# Patient Record
Sex: Female | Born: 1965 | Race: White | Hispanic: No | Marital: Married | Smoking: Current every day smoker
Health system: Southern US, Community
[De-identification: ages and names within clinical notes are randomized; demographics above are authoritative.]

## PROBLEM LIST (undated history)

## (undated) DIAGNOSIS — F329 Major depressive disorder, single episode, unspecified: Secondary | ICD-10-CM

## (undated) DIAGNOSIS — F429 Obsessive-compulsive disorder, unspecified: Secondary | ICD-10-CM

## (undated) DIAGNOSIS — F419 Anxiety disorder, unspecified: Secondary | ICD-10-CM

## (undated) DIAGNOSIS — Z720 Tobacco use: Secondary | ICD-10-CM

## (undated) DIAGNOSIS — G43909 Migraine, unspecified, not intractable, without status migrainosus: Secondary | ICD-10-CM

## (undated) DIAGNOSIS — F32A Depression, unspecified: Secondary | ICD-10-CM

## (undated) HISTORY — DX: Obsessive-compulsive disorder, unspecified: F42.9

## (undated) HISTORY — DX: Tobacco use: Z72.0

## (undated) HISTORY — PX: WISDOM TOOTH EXTRACTION: SHX21

## (undated) HISTORY — DX: Depression, unspecified: F32.A

## (undated) HISTORY — PX: BUNIONECTOMY: SHX129

## (undated) HISTORY — DX: Migraine, unspecified, not intractable, without status migrainosus: G43.909

## (undated) HISTORY — DX: Anxiety disorder, unspecified: F41.9

## (undated) HISTORY — DX: Major depressive disorder, single episode, unspecified: F32.9

---

## 2004-07-26 ENCOUNTER — Emergency Department: Payer: Self-pay | Admitting: Emergency Medicine

## 2004-07-27 ENCOUNTER — Ambulatory Visit: Payer: Self-pay | Admitting: Emergency Medicine

## 2005-07-27 ENCOUNTER — Ambulatory Visit: Payer: Self-pay | Admitting: Unknown Physician Specialty

## 2005-07-27 LAB — HM COLONOSCOPY

## 2005-11-15 ENCOUNTER — Ambulatory Visit: Payer: Self-pay | Admitting: Urology

## 2006-09-22 ENCOUNTER — Emergency Department: Payer: Self-pay

## 2009-01-07 HISTORY — PX: BREAST BIOPSY: SHX20

## 2009-05-07 ENCOUNTER — Ambulatory Visit: Payer: Self-pay | Admitting: Internal Medicine

## 2009-05-31 ENCOUNTER — Ambulatory Visit: Payer: Self-pay | Admitting: Internal Medicine

## 2009-06-07 ENCOUNTER — Ambulatory Visit: Payer: Self-pay | Admitting: Internal Medicine

## 2009-06-09 ENCOUNTER — Ambulatory Visit: Payer: Self-pay | Admitting: Surgery

## 2009-06-15 ENCOUNTER — Ambulatory Visit: Payer: Self-pay | Admitting: Surgery

## 2010-05-14 ENCOUNTER — Ambulatory Visit: Payer: Self-pay

## 2010-08-07 ENCOUNTER — Ambulatory Visit: Payer: Self-pay

## 2011-08-10 ENCOUNTER — Emergency Department: Payer: Self-pay | Admitting: Emergency Medicine

## 2011-11-08 ENCOUNTER — Ambulatory Visit: Payer: Self-pay

## 2012-11-12 ENCOUNTER — Ambulatory Visit: Payer: Self-pay | Admitting: Neurology

## 2013-03-03 ENCOUNTER — Ambulatory Visit: Payer: Self-pay | Admitting: Orthopedic Surgery

## 2014-01-17 ENCOUNTER — Ambulatory Visit: Payer: Self-pay | Admitting: Anesthesiology

## 2014-02-15 ENCOUNTER — Emergency Department: Payer: Self-pay | Admitting: Internal Medicine

## 2014-02-23 LAB — HM PAP SMEAR: HM PAP: NEGATIVE

## 2014-03-15 ENCOUNTER — Ambulatory Visit: Payer: Self-pay

## 2014-03-15 LAB — HM MAMMOGRAPHY

## 2014-08-14 ENCOUNTER — Emergency Department
Admission: EM | Admit: 2014-08-14 | Discharge: 2014-08-14 | Disposition: A | Discharge status: Home / Self Care | Payer: BLUE CROSS/BLUE SHIELD | Attending: Emergency Medicine | Admitting: Emergency Medicine

## 2014-08-14 ENCOUNTER — Other Ambulatory Visit: Payer: Self-pay

## 2014-08-14 ENCOUNTER — Encounter: Payer: Self-pay | Admitting: Emergency Medicine

## 2014-08-14 ENCOUNTER — Emergency Department: Payer: BLUE CROSS/BLUE SHIELD

## 2014-08-14 DIAGNOSIS — S2232XA Fracture of one rib, left side, initial encounter for closed fracture: Secondary | ICD-10-CM | POA: Insufficient documentation

## 2014-08-14 DIAGNOSIS — Y998 Other external cause status: Secondary | ICD-10-CM | POA: Insufficient documentation

## 2014-08-14 DIAGNOSIS — X58XXXA Exposure to other specified factors, initial encounter: Secondary | ICD-10-CM | POA: Diagnosis not present

## 2014-08-14 DIAGNOSIS — Y9289 Other specified places as the place of occurrence of the external cause: Secondary | ICD-10-CM | POA: Insufficient documentation

## 2014-08-14 DIAGNOSIS — Z720 Tobacco use: Secondary | ICD-10-CM | POA: Insufficient documentation

## 2014-08-14 DIAGNOSIS — Y9341 Activity, dancing: Secondary | ICD-10-CM | POA: Diagnosis not present

## 2014-08-14 DIAGNOSIS — S299XXA Unspecified injury of thorax, initial encounter: Secondary | ICD-10-CM | POA: Diagnosis present

## 2014-08-14 LAB — BASIC METABOLIC PANEL
Anion gap: 8 (ref 5–15)
BUN: 15 mg/dL (ref 6–20)
CO2: 27 mmol/L (ref 22–32)
CREATININE: 0.62 mg/dL (ref 0.44–1.00)
Calcium: 10.2 mg/dL (ref 8.9–10.3)
Chloride: 104 mmol/L (ref 101–111)
Glucose, Bld: 94 mg/dL (ref 65–99)
POTASSIUM: 4 mmol/L (ref 3.5–5.1)
Sodium: 139 mmol/L (ref 135–145)

## 2014-08-14 LAB — CBC
HCT: 37.8 % (ref 35.0–47.0)
Hemoglobin: 13.1 g/dL (ref 12.0–16.0)
MCH: 33.5 pg (ref 26.0–34.0)
MCHC: 34.8 g/dL (ref 32.0–36.0)
MCV: 96.3 fL (ref 80.0–100.0)
Platelets: 383 10*3/uL (ref 150–440)
RBC: 3.92 MIL/uL (ref 3.80–5.20)
RDW: 12.7 % (ref 11.5–14.5)
WBC: 10 10*3/uL (ref 3.6–11.0)

## 2014-08-14 LAB — TROPONIN I: Troponin I: <0.03 ng/mL (ref <0.031)

## 2014-08-14 MED ORDER — NAPROXEN 250 MG PO TABS: 250.0000 mg | ORAL_TABLET | Freq: Two times a day (BID) | ORAL | Status: DC

## 2014-08-14 MED ORDER — OXYCODONE HCL 5 MG PO TABS: 5.0000 mg | ORAL_TABLET | Freq: Four times a day (QID) | ORAL | Status: DC | PRN

## 2014-08-14 NOTE — ED Provider Notes (Signed)
[facility] Emergency Department Provider Note  ____________________________________________  Time seen: 4:00 PM  I have reviewed the triage vital signs and the nursing notes.   HISTORY  Chief Complaint Chest Pain    HPI Shelly Tyler is a 49 y.o. female who complains of left chest pain for the past 5 days. She did travel by airplane to [place] 1-2 weeks ago. She has no history of DVT or PE. Upon returning she was in her usual state of health. However, after going dancing late into the night on Monday, she woke up 5 days ago on Tuesday with sharp left chest pain. Hurts to take a deep breath. It's nonradiating. No exertional pain fever chills cough hemoptysis abdominal pain nausea vomiting or diarrhea. No history of trauma.Worse with movement and lying on her left side, no alleviating factors.     History reviewed. No pertinent past medical history.  There are no active problems to display for this patient.   Past Surgical History  Procedure Laterality Date  . Cesarean section    . Bunionectomy    . Breast biopsy      Current Outpatient Rx  Name  Route  Sig  Dispense  Refill  . naproxen (NAPROSYN) 250 MG tablet   Oral   Take 1 tablet (250 mg total) by mouth 2 (two) times daily with a meal.   40 tablet   0   . oxyCODONE (ROXICODONE) 5 MG immediate release tablet   Oral   Take 1 tablet (5 mg total) by mouth every 6 (six) hours as needed for breakthrough pain.   20 tablet   0     Allergies Review of patient's allergies indicates no known allergies.  No family history on file.  Social History History  Substance Use Topics  . Smoking status: Current Every Day Smoker    Types: Cigarettes  . Smokeless tobacco: Not on file  . Alcohol Use: No    Review of Systems  Constitutional: No fever or chills. No weight changes Eyes:No blurry vision or double vision.  ENT: No sore throat. Cardiovascular: Chest pain as above Respiratory: No  dyspnea or cough. Gastrointestinal: Negative for abdominal pain, vomiting and diarrhea.  No BRBPR or melena. Genitourinary: Negative for dysuria, urinary retention, bloody urine, or difficulty urinating. Musculoskeletal: Negative for back pain. No joint swelling or pain. Skin: Negative for rash. Neurological: Negative for headaches, focal weakness or numbness. Psychiatric:No anxiety or depression.   Endocrine:No hot/cold intolerance, changes in energy, or sleep difficulty.  10-point ROS otherwise negative.  ____________________________________________   PHYSICAL EXAM:  VITAL SIGNS: ED Triage Vitals  Enc Vitals Group     BP 08/14/14 1513 134/82 mmHg     Pulse Rate 08/14/14 1513 82     Resp 08/14/14 1606 18     Temp 08/14/14 1513 98.4 F (36.9 C)     Temp Source 08/14/14 1513 Oral     SpO2 08/14/14 1513 98 %     Weight 08/14/14 1513 128 lb (58.06 kg)     Height 08/14/14 1513  (1.676 m)     Head Cir --      Peak Flow --      Pain Score 08/14/14 1516 7     Pain Loc --      Pain Edu? --      Excl. in GC? --      Constitutional: Alert and oriented. Well appearing and in no distress. Eyes: No scleral icterus. No conjunctival  pallor. PERRL. EOMI ENT   Head: Normocephalic and atraumatic.   Nose: No congestion/rhinnorhea. No septal hematoma   Mouth/Throat: MMM, no pharyngeal erythema. No peritonsillar mass. No uvula shift.   Neck: No stridor. No SubQ emphysema. No meningismus. Hematological/Lymphatic/Immunilogical: No cervical lymphadenopathy. Cardiovascular: RRR. Normal and symmetric distal pulses are present in all extremities. No murmurs, rubs, or gallops. Respiratory: Normal respiratory effort without tachypnea nor retractions. Breath sounds are clear and equal bilaterally. No wheezes/rales/rhonchi. Exquisite tenderness to palpation on the left inferior chest wall in the anterolateral inferior ribs. Gastrointestinal: Soft and nontender. No distention. There  is no CVA tenderness.  No rebound, rigidity, or guarding. Genitourinary: deferred Musculoskeletal: Nontender with normal range of motion in all extremities. No joint effusions.  No lower extremity tenderness.  No edema. Neurologic:   Normal speech and language.  CN 2-10 normal. Motor grossly intact. No pronator drift.  Normal gait. No gross focal neurologic deficits are appreciated.  Skin:  Skin is warm, dry and intact. No rash noted.  No petechiae, purpura, or bullae. Psychiatric: Mood and affect are normal. Speech and behavior are normal. Patient exhibits appropriate insight and judgment.  ____________________________________________    LABS (pertinent positives/negatives) (all labs ordered are listed, but only abnormal results are displayed) Labs Reviewed  BASIC METABOLIC PANEL  CBC  TROPONIN I   ____________________________________________   EKG  Interpreted by me Normal sinus rhythm rate of 71, normal axis and intervals, poor R-wave progression in anterior precordial leads, normal ST segments and T waves.  ____________________________________________    RADIOLOGY  Chest x-ray reveals a nondisplaced fracture of the left lateral 10th rib in the area of tenderness.  ____________________________________________   PROCEDURES  ____________________________________________   INITIAL IMPRESSION / ASSESSMENT AND PLAN / ED COURSE  Pertinent labs & imaging results that were available during my care of the patient were reviewed by me and considered in my medical decision making (see chart for details).  Patient presents with left chest pain that is found to be due to a rib fracture. Exam and chest x-ray are entirely consistent with this and corroborate each other. There is no history of trauma, although the patient's dancing may have led to a nontraumatic minor injury. No suspicions for cancer on the x-ray. We'll have the patient follow up with primary care while giving her  an incentive spirometer and having her take NSAIDs and oxycodone as needed.  ____________________________________________   FINAL CLINICAL IMPRESSION(S) / ED DIAGNOSES  Final diagnoses:  Left rib fracture, closed, initial encounter      Sharman Cheek, MD 08/14/14 1615

## 2014-08-14 NOTE — ED Notes (Signed)
Pt informed to return if any life threatening symptoms occur.  

## 2014-08-14 NOTE — Discharge Instructions (Signed)

## 2014-08-14 NOTE — ED Notes (Addendum)
Pt reports flying to [place] last week, reports she went dancing Monday night and woke up Tuesday with left sided chest pain. Pt reports pain just has gotten increasingly worse. Pt denies any shortness of breath. Pt does smoke cigarettes. Pt reports pain with movement and pain with coughing, laughing.

## 2015-02-19 ENCOUNTER — Emergency Department: Payer: BLUE CROSS/BLUE SHIELD

## 2015-02-19 ENCOUNTER — Emergency Department
Admission: EM | Admit: 2015-02-19 | Discharge: 2015-02-19 | Disposition: A | Discharge status: Home / Self Care | Payer: BLUE CROSS/BLUE SHIELD | Attending: Emergency Medicine | Admitting: Emergency Medicine

## 2015-02-19 DIAGNOSIS — Z791 Long term (current) use of non-steroidal anti-inflammatories (NSAID): Secondary | ICD-10-CM | POA: Insufficient documentation

## 2015-02-19 DIAGNOSIS — M654 Radial styloid tenosynovitis [de Quervain]: Secondary | ICD-10-CM

## 2015-02-19 DIAGNOSIS — F17210 Nicotine dependence, cigarettes, uncomplicated: Secondary | ICD-10-CM | POA: Insufficient documentation

## 2015-02-19 MED ORDER — MELOXICAM 15 MG PO TABS: 15.0000 mg | ORAL_TABLET | Freq: Every day | ORAL | Status: DC

## 2015-02-19 NOTE — ED Notes (Signed)
Pt reports falling Sunday in the Shower. Pt states she caught herself Pain in left wrist.

## 2015-02-19 NOTE — ED Provider Notes (Signed)
[facility] Emergency Department Provider Note  ____________________________________________  Time seen: Approximately 3:23 PM  I have reviewed the triage vital signs and the nursing notes.   HISTORY  Chief Complaint Wrist Pain    HPI Shelly Tyler is a 50 y.o. female presents emergency department complaining of left wrist pain.Patient states that symptoms initially began 3 weeks ago. No history of injury at that time. Patient states that the pain is on the dorsal aspect of the hand centering about the thumb. Patient states that 2-3 days ago she suffered a fall and caught herself with her left hand. This is increased pain in her left hand/wrist. Patient denies any numbness or tingling, decreased grip strength, pain in the elbow. Patient has not taken any medication prior to arrival.   No past medical history on file.  There are no active problems to display for this patient.   Past Surgical History  Procedure Laterality Date  . Cesarean section    . Bunionectomy    . Breast biopsy      Current Outpatient Rx  Name  Route  Sig  Dispense  Refill  . meloxicam (MOBIC) 15 MG tablet   Oral   Take 1 tablet (15 mg total) by mouth daily.   30 tablet   0   . naproxen (NAPROSYN) 250 MG tablet   Oral   Take 1 tablet (250 mg total) by mouth 2 (two) times daily with a meal.   40 tablet   0   . oxyCODONE (ROXICODONE) 5 MG immediate release tablet   Oral   Take 1 tablet (5 mg total) by mouth every 6 (six) hours as needed for breakthrough pain.   20 tablet   0     Allergies Review of patient's allergies indicates no known allergies.  No family history on file.  Social History Social History  Substance Use Topics  . Smoking status: Current Every Day Smoker    Types: Cigarettes  . Smokeless tobacco: Not on file  . Alcohol Use: No     Review of Systems  Constitutional: No fever/chills Musculoskeletal: Negative for back pain. Positive for left  wrist pain. Skin: Negative for rash. Neurological: Negative for headaches, focal weakness or numbness. 10-point ROS otherwise negative.  ____________________________________________   PHYSICAL EXAM:  VITAL SIGNS: ED Triage Vitals  Enc Vitals Group     BP 02/19/15 1309 119/78 mmHg     Pulse Rate 02/19/15 1309 93     Resp 02/19/15 1309 18     Temp 02/19/15 1309 98.6 F (37 C)     Temp Source 02/19/15 1309 Oral     SpO2 02/19/15 1309 98 %     Weight 02/19/15 1309 130 lb (58.968 kg)     Height 02/19/15 1309  (1.676 m)     Head Cir --      Peak Flow --      Pain Score 02/19/15 1311 5     Pain Loc --      Pain Edu? --      Excl. in GC? --      Constitutional: Alert and oriented. Well appearing and in no acute distress. Cardiovascular: Normal rate, regular rhythm. Normal S1 and S2.  Good peripheral circulation. Respiratory: Normal respiratory effort without tachypnea or retractions. Lungs CTAB. Musculoskeletal: No lower extremity tenderness nor edema.  No joint effusions. No visible deformity to left wrist when comparing the right. Full range of motion of wrist. No palpable abnormality.  Patient is mild tenderness to palpation over the MCP joint of the thumb. Negative Tinel's and Phalen's. Positive Finkelstein's. Radial pulse intact. Brisk capillary refill 5 digits. Sensation intact all 5 digits. Neurologic:  Normal speech and language. No gross focal neurologic deficits are appreciated.  Skin:  Skin is warm, dry and intact. No rash noted. Psychiatric: Mood and affect are normal. Speech and behavior are normal. Patient exhibits appropriate insight and judgement.   ____________________________________________   LABS (all labs ordered are listed, but only abnormal results are displayed)  Labs Reviewed - No data to display ____________________________________________  EKG   ____________________________________________  RADIOLOGY Festus Barren Cuthriell, personally  viewed and evaluated these images (plain radiographs) as part of my medical decision making, as well as reviewing the written report by the radiologist.  Dg Wrist Complete Left  02/19/2015  CLINICAL DATA:  Pain following fall 1 week prior EXAM: LEFT WRIST - COMPLETE 3+ VIEW COMPARISON:  None. FINDINGS: Frontal, oblique, lateral, and ulnar deviation scaphoid images were obtained. There is no demonstrable fracture or dislocation. Joint spaces appear normal. No erosive change. IMPRESSION: No demonstrable fracture or dislocation. No appreciable arthropathy. Electronically Signed   By: Bretta Bang III M.D.   On: 02/19/2015 13:59    ____________________________________________    PROCEDURES  Procedure(s) performed:       Medications - No data to display   ____________________________________________   INITIAL IMPRESSION / ASSESSMENT AND PLAN / ED COURSE  Pertinent labs & imaging results that were available during my care of the patient were reviewed by me and considered in my medical decision making (see chart for details).  Patient's diagnosis is consistent with de Quervain's tenosynovitis. Patient will be discharged home with prescriptions for anti-inflammatories and instructions to use a thumb spica splint at home.. Patient is to follow up with orthopedics if symptoms persist past this treatment course. Patient is given ED precautions to return to the ED for any worsening or new symptoms.     ____________________________________________  FINAL CLINICAL IMPRESSION(S) / ED DIAGNOSES  Final diagnoses:  De Quervain's tenosynovitis, left      NEW MEDICATIONS STARTED DURING THIS VISIT:  New Prescriptions   MELOXICAM (MOBIC) 15 MG TABLET    Take 1 tablet (15 mg total) by mouth daily.        Delorise Royals Cuthriell, PA-C 02/19/15 1528  Myrna Blazer, MD 02/19/15 [phone]

## 2015-02-19 NOTE — ED Notes (Signed)
Patient states she started having wrist pain 3 weeks ago and fell on Thursday and tried to catch herself on the left hand.  Patient c/o pain when moving her wrist but is able to grab items and make a fist without difficulty.

## 2015-02-19 NOTE — Discharge Instructions (Signed)
De Quervain Tenosynovitis °Tendons attach muscles to bones. They also help with joint movements. When tendons become irritated or swollen, it is called tendinitis. °The extensor pollicis brevis (EPB) tendon connects the EPB muscle to a bone that is near the base of the thumb. The EPB muscle helps to straighten and extend the thumb. De Quervain tenosynovitis is a condition in which the EPB tendon lining (sheath) becomes irritated, thickened, and swollen. This condition is sometimes called stenosing tenosynovitis. This condition causes pain on the thumb side of the back of the wrist. °CAUSES °Causes of this condition include: °· Activities that repeatedly cause your thumb and wrist to extend. °· A sudden increase in activity or change in activity that affects your wrist. °RISK FACTORS: °This condition is more likely to develop in: °· Females. °· People who have diabetes. °· Women who have recently given birth. °· People who are over 40 years of age. °· People who do activities that involve repeated hand and wrist motions, such as tennis, racquetball, volleyball, gardening, and taking care of children. °· People who do heavy labor. °· People who have poor wrist strength and flexibility. °· People who do not warm up properly before activities. °SYMPTOMS °Symptoms of this condition include: °· Pain or tenderness over the thumb side of the back of the wrist when your thumb and wrist are not moving. °· Pain that gets worse when you straighten your thumb or extend your thumb or wrist. °· Pain when the injured area is touched. °· Locking or catching of the thumb joint while you bend and straighten your thumb. °· Decreased thumb motion due to pain. °· Swelling over the affected area. °DIAGNOSIS °This condition is diagnosed with a medical history and physical exam. Your health care provider will ask for details about your injury and ask about your symptoms. °TREATMENT °Treatment may include the use of icing and medicines to  reduce pain and swelling. You may also be advised to wear a splint or brace to limit your thumb and wrist motion. In less severe cases, treatment may also include working with a physical therapist to strengthen your wrist and calm the irritation around your EPB tendon sheath. In severe cases, surgery may be needed. °HOME CARE INSTRUCTIONS °If You Have a Splint or Brace: °· Wear it as told by your health care provider. Remove it only as told by your health care provider. °· Loosen the splint or brace if your fingers become numb and tingle, or if they turn cold and blue. °· Keep the splint or brace clean and dry. °Managing Pain, Stiffness, and Swelling  °· If directed, apply ice to the injured area. °¨ Put ice in a plastic bag. °¨ Place a towel between your skin and the bag. °¨ Leave the ice on for 20 minutes, 2-3 times per day. °· Move your fingers often to avoid stiffness and to lessen swelling. °· Raise (elevate) the injured area above the level of your heart while you are sitting or lying down. °General Instructions °· Return to your normal activities as told by your health care provider. Ask your health care provider what activities are safe for you. °· Take over-the-counter and prescription medicines only as told by your health care provider. °· Keep all follow-up visits as told by your health care provider. This is important. °· Do not drive or operate heavy machinery while taking prescription pain medicine. °SEEK MEDICAL CARE IF: °· Your pain, tenderness, or swelling gets worse, even if you have had   treatment.  You have numbness or tingling in your wrist, hand, or fingers on the injured side.   This information is not intended to replace advice given to you by your health care provider. Make sure you discuss any questions you have with your health care provider.   Document Released: 12/24/2004 Document Revised: 09/14/2014 Document Reviewed: 03/01/2014 Elsevier Interactive Patient Education 2016  [employer].    Obtain a bone graft thumb spica splint from pharmacy or [facility]. Uses much as possible during the day, but wear all night.

## 2015-02-23 ENCOUNTER — Emergency Department
Admission: EM | Admit: 2015-02-23 | Discharge: 2015-02-23 | Disposition: A | Discharge status: Home / Self Care | Payer: BLUE CROSS/BLUE SHIELD | Attending: Emergency Medicine | Admitting: Emergency Medicine

## 2015-02-23 DIAGNOSIS — W57XXXA Bitten or stung by nonvenomous insect and other nonvenomous arthropods, initial encounter: Secondary | ICD-10-CM | POA: Insufficient documentation

## 2015-02-23 DIAGNOSIS — Y9289 Other specified places as the place of occurrence of the external cause: Secondary | ICD-10-CM | POA: Diagnosis not present

## 2015-02-23 DIAGNOSIS — L089 Local infection of the skin and subcutaneous tissue, unspecified: Secondary | ICD-10-CM | POA: Diagnosis not present

## 2015-02-23 DIAGNOSIS — F17210 Nicotine dependence, cigarettes, uncomplicated: Secondary | ICD-10-CM | POA: Insufficient documentation

## 2015-02-23 DIAGNOSIS — Z791 Long term (current) use of non-steroidal anti-inflammatories (NSAID): Secondary | ICD-10-CM | POA: Insufficient documentation

## 2015-02-23 DIAGNOSIS — Y9389 Activity, other specified: Secondary | ICD-10-CM | POA: Diagnosis not present

## 2015-02-23 DIAGNOSIS — Y998 Other external cause status: Secondary | ICD-10-CM | POA: Diagnosis not present

## 2015-02-23 DIAGNOSIS — S0086XA Insect bite (nonvenomous) of other part of head, initial encounter: Secondary | ICD-10-CM | POA: Diagnosis not present

## 2015-02-23 MED ORDER — SULFAMETHOXAZOLE-TRIMETHOPRIM 800-160 MG PO TABS: 1.0000 | ORAL_TABLET | Freq: Two times a day (BID) | ORAL | Status: DC

## 2015-02-23 MED ORDER — OXYCODONE-ACETAMINOPHEN 5-325 MG PO TABS: 1.0000 | ORAL_TABLET | ORAL | Status: DC | PRN

## 2015-02-23 NOTE — ED Notes (Signed)
Pt with red infected area to chin, unsure of insect bite.

## 2015-02-23 NOTE — ED Notes (Signed)
Pt reports swelling/pain to chin x 2 days.  Pt states unsure of bug bite.  Pt reports numbness/tingling to area.  Pt reports using warm compress w/o relief.  Pt NAD at this time, respirations equal and unlabored, skin warm and dry.

## 2015-02-23 NOTE — ED Provider Notes (Signed)
[facility] Emergency Department Provider Note  ____________________________________________  Time seen: Approximately 8:53 PM  I have reviewed the triage vital signs and the nursing notes.   HISTORY  Chief Complaint Wound Infection    HPI Shelly Tyler is a 50 y.o. female she complaining of swollen pain to the anterior inferior chin onset was 2 days ago status post being bit by insect of unknown origin. Patient denies any fever with this complaint. Patient state mildly displaced warm compresses to the area. Patient has taken ibuprofen. No other palliative measures.She rates the pain as a 8/10. Patient describes the pain as "sharp".   No past medical history on file.  There are no active problems to display for this patient.   Past Surgical History  Procedure Laterality Date  . Cesarean section    . Bunionectomy    . Breast biopsy      Current Outpatient Rx  Name  Route  Sig  Dispense  Refill  . meloxicam (MOBIC) 15 MG tablet   Oral   Take 1 tablet (15 mg total) by mouth daily.   30 tablet   0   . naproxen (NAPROSYN) 250 MG tablet   Oral   Take 1 tablet (250 mg total) by mouth 2 (two) times daily with a meal.   40 tablet   0   . oxyCODONE (ROXICODONE) 5 MG immediate release tablet   Oral   Take 1 tablet (5 mg total) by mouth every 6 (six) hours as needed for breakthrough pain.   20 tablet   0   . oxyCODONE-acetaminophen (ROXICET) 5-325 MG tablet   Oral   Take 1 tablet by mouth every 4 (four) hours as needed for severe pain.   30 tablet   0   . sulfamethoxazole-trimethoprim (BACTRIM DS,SEPTRA DS) 800-160 MG tablet   Oral   Take 1 tablet by mouth 2 (two) times daily.   20 tablet   0     Allergies Review of patient's allergies indicates no known allergies.  No family history on file.  Social History Social History  Substance Use Topics  . Smoking status: Current Every Day Smoker    Types: Cigarettes  . Smokeless tobacco:  Not on file  . Alcohol Use: No    Review of Systems Constitutional: No fever/chills Eyes: No visual changes. ENT: No sore throat. Cardiovascular: Denies chest pain. Respiratory: Denies shortness of breath. Gastrointestinal: No abdominal pain.  No nausea, no vomiting.  No diarrhea.  No constipation. Genitourinary: Negative for dysuria. Musculoskeletal: Negative for back pain. Skin: Negative for rash. Redness and swelling to the chin.  Neurological: Negative for headaches, focal weakness or numbness. 10-point ROS otherwise negative.  ____________________________________________   PHYSICAL EXAM:  VITAL SIGNS: ED Triage Vitals  Enc Vitals Group     BP 02/23/15 2043 133/77 mmHg     Pulse Rate 02/23/15 2043 74     Resp 02/23/15 2043 18     Temp 02/23/15 2043 98.2 F (36.8 C)     Temp Source 02/23/15 2043 Oral     SpO2 02/23/15 2043 97 %     Weight --      Height --      Head Cir --      Peak Flow --      Pain Score 02/23/15 2036 8     Pain Loc --      Pain Edu? --      Excl. in GC? --  Constitutional: Alert and oriented. Well appearing and in no acute distress. Eyes: Conjunctivae are normal. PERRL. EOMI. Head: Atraumatic. Nose: No congestion/rhinnorhea. Mouth/Throat: Mucous membranes are moist.  Oropharynx non-erythematous. Neck: No stridor.  No cervical spine tenderness to palpation. Hematological/Lymphatic/Immunilogical: No cervical lymphadenopathy. Cardiovascular: Normal rate, regular rhythm. Grossly normal heart sounds.  Good peripheral circulation. Respiratory: Normal respiratory effort.  No retractions. Lungs CTAB. Gastrointestinal: Soft and nontender. No distention. No abdominal bruits. No CVA tenderness. Musculoskeletal: No lower extremity tenderness nor edema.  No joint effusions. Neurologic:  Normal speech and language. No gross focal neurologic deficits are appreciated. No gait instability. Skin:  Skin is warm, dry and intact. No rash noted. Papular  lesion on erythematous base at the inferior anterior mandible. Psychiatric: Mood and affect are normal. Speech and behavior are normal.  ____________________________________________   LABS (all labs ordered are listed, but only abnormal results are displayed)  Labs Reviewed - No data to display ____________________________________________  EKG   ____________________________________________  RADIOLOGY   ____________________________________________   PROCEDURES  Procedure(s) performed: None  Critical Care performed: No  ____________________________________________   INITIAL IMPRESSION / ASSESSMENT AND PLAN / ED COURSE  Pertinent labs & imaging results that were available during my care of the patient were reviewed by me and considered in my medical decision making (see chart for details).  Infected insect bite. Patient given discharge care instructions. Patient given a prescription for Percocet and Bactrim. Patient given a work note. Asked to follow-up family doctor if no improvement 3 days. ____________________________________________   FINAL CLINICAL IMPRESSION(S) / ED DIAGNOSES  Final diagnoses:  Insect bite, infected      Joni Reining, PA-C 02/23/15 2104  Myrna Blazer, MD 02/23/15 2322

## 2015-02-26 ENCOUNTER — Inpatient Hospital Stay
Admission: EM | Admit: 2015-02-26 | Discharge: 2015-02-28 | DRG: 603 | Disposition: A | Discharge status: Home / Self Care | Payer: BLUE CROSS/BLUE SHIELD | Attending: Internal Medicine | Admitting: Internal Medicine

## 2015-02-26 ENCOUNTER — Encounter: Payer: Self-pay | Admitting: Emergency Medicine

## 2015-02-26 ENCOUNTER — Emergency Department: Payer: BLUE CROSS/BLUE SHIELD

## 2015-02-26 DIAGNOSIS — L0390 Cellulitis, unspecified: Secondary | ICD-10-CM | POA: Diagnosis present

## 2015-02-26 DIAGNOSIS — Z79899 Other long term (current) drug therapy: Secondary | ICD-10-CM | POA: Diagnosis not present

## 2015-02-26 DIAGNOSIS — F17210 Nicotine dependence, cigarettes, uncomplicated: Secondary | ICD-10-CM | POA: Diagnosis present

## 2015-02-26 DIAGNOSIS — Z833 Family history of diabetes mellitus: Secondary | ICD-10-CM

## 2015-02-26 DIAGNOSIS — L03211 Cellulitis of face: Secondary | ICD-10-CM | POA: Diagnosis present

## 2015-02-26 DIAGNOSIS — Z9889 Other specified postprocedural states: Secondary | ICD-10-CM

## 2015-02-26 DIAGNOSIS — J351 Hypertrophy of tonsils: Secondary | ICD-10-CM | POA: Diagnosis present

## 2015-02-26 LAB — CBC WITH DIFFERENTIAL/PLATELET
Basophils Absolute: 0 10*3/uL (ref 0–0.1)
Basophils Relative: 0 %
Eosinophils Absolute: 0 10*3/uL (ref 0–0.7)
Eosinophils Relative: 0 %
HEMATOCRIT: 38.4 % (ref 35.0–47.0)
HEMOGLOBIN: 13.3 g/dL (ref 12.0–16.0)
LYMPHS ABS: 1.6 10*3/uL (ref 1.0–3.6)
LYMPHS PCT: 11 %
MCH: 32.9 pg (ref 26.0–34.0)
MCHC: 34.7 g/dL (ref 32.0–36.0)
MCV: 94.8 fL (ref 80.0–100.0)
MONOS PCT: 7 %
Monocytes Absolute: 1 10*3/uL — ABNORMAL HIGH (ref 0.2–0.9)
NEUTROS ABS: 11.4 10*3/uL — AB (ref 1.4–6.5)
NEUTROS PCT: 82 %
Platelets: 401 10*3/uL (ref 150–440)
RBC: 4.05 MIL/uL (ref 3.80–5.20)
RDW: 13.1 % (ref 11.5–14.5)
WBC: 14.1 10*3/uL — ABNORMAL HIGH (ref 3.6–11.0)

## 2015-02-26 LAB — BASIC METABOLIC PANEL
Anion gap: 9 (ref 5–15)
BUN: 14 mg/dL (ref 6–20)
CHLORIDE: 102 mmol/L (ref 101–111)
CO2: 25 mmol/L (ref 22–32)
Calcium: 10.3 mg/dL (ref 8.9–10.3)
Creatinine, Ser: 0.77 mg/dL (ref 0.44–1.00)
GFR calc Af Amer: >60 mL/min (ref >60)
GFR calc non Af Amer: >60 mL/min (ref >60)
Glucose, Bld: 95 mg/dL (ref 65–99)
Potassium: 4.3 mmol/L (ref 3.5–5.1)
Sodium: 136 mmol/L (ref 135–145)

## 2015-02-26 MED ORDER — ACETAMINOPHEN 650 MG RE SUPP: 650.0000 mg | Freq: Four times a day (QID) | RECTAL | Status: DC | PRN

## 2015-02-26 MED ORDER — ONDANSETRON HCL 4 MG PO TABS: 4.0000 mg | ORAL_TABLET | Freq: Four times a day (QID) | ORAL | Status: DC | PRN

## 2015-02-26 MED ORDER — CEFAZOLIN SODIUM 1-5 GM-% IV SOLN
1.0000 g | Freq: Three times a day (TID) | INTRAVENOUS | Status: DC
  Administered 2015-02-26 – 2015-02-28 (×6): 1 g via INTRAVENOUS
  Filled 2015-02-26 (×10): qty 50

## 2015-02-26 MED ORDER — MORPHINE SULFATE (PF) 2 MG/ML IV SOLN
1.0000 mg | INTRAVENOUS | Status: DC | PRN
  Administered 2015-02-26: 1 mg via INTRAVENOUS
  Filled 2015-02-26: qty 1

## 2015-02-26 MED ORDER — HYDROMORPHONE HCL 1 MG/ML IJ SOLN
1.0000 mg | Freq: Once | INTRAMUSCULAR | Status: AC
  Administered 2015-02-26: 1 mg via INTRAVENOUS
  Filled 2015-02-26: qty 1

## 2015-02-26 MED ORDER — KETOROLAC TROMETHAMINE 30 MG/ML IJ SOLN
15.0000 mg | Freq: Once | INTRAMUSCULAR | Status: AC
  Administered 2015-02-26: 15 mg via INTRAVENOUS
  Filled 2015-02-26: qty 1

## 2015-02-26 MED ORDER — PIPERACILLIN-TAZOBACTAM 3.375 G IVPB
3.3750 g | Freq: Once | INTRAVENOUS | Status: AC
  Administered 2015-02-26: 3.375 g via INTRAVENOUS
  Filled 2015-02-26 (×2): qty 50

## 2015-02-26 MED ORDER — OXYCODONE HCL 5 MG PO TABS
5.0000 mg | ORAL_TABLET | ORAL | Status: DC | PRN
  Administered 2015-02-26 – 2015-02-27 (×2): 5 mg via ORAL
  Filled 2015-02-26 (×2): qty 1

## 2015-02-26 MED ORDER — ACETAMINOPHEN 325 MG PO TABS: 650.0000 mg | ORAL_TABLET | Freq: Four times a day (QID) | ORAL | Status: DC | PRN

## 2015-02-26 MED ORDER — ONDANSETRON HCL 4 MG/2ML IJ SOLN
4.0000 mg | Freq: Once | INTRAMUSCULAR | Status: AC
  Administered 2015-02-26: 4 mg via INTRAVENOUS
  Filled 2015-02-26: qty 2

## 2015-02-26 MED ORDER — ENOXAPARIN SODIUM 40 MG/0.4ML ~~LOC~~ SOLN
40.0000 mg | SUBCUTANEOUS | Status: DC
  Filled 2015-02-26: qty 0.4

## 2015-02-26 MED ORDER — MORPHINE SULFATE (PF) 2 MG/ML IV SOLN
2.0000 mg | INTRAVENOUS | Status: DC | PRN
  Administered 2015-02-26: 1 mg via INTRAVENOUS
  Administered 2015-02-27 (×2): 2 mg via INTRAVENOUS
  Filled 2015-02-26 (×3): qty 1

## 2015-02-26 MED ORDER — ONDANSETRON HCL 4 MG/2ML IJ SOLN: 4.0000 mg | Freq: Four times a day (QID) | INTRAMUSCULAR | Status: DC | PRN

## 2015-02-26 MED ORDER — DOXYCYCLINE HYCLATE 100 MG IV SOLR
100.0000 mg | Freq: Once | INTRAVENOUS | Status: AC
  Administered 2015-02-26: 100 mg via INTRAVENOUS
  Filled 2015-02-26: qty 100

## 2015-02-26 MED ORDER — IOHEXOL 300 MG/ML  SOLN
75.0000 mL | Freq: Once | INTRAMUSCULAR | Status: AC | PRN
  Administered 2015-02-26: 75 mL via INTRAVENOUS

## 2015-02-26 NOTE — ED Provider Notes (Signed)
Time Seen: Approximately *11 AM I have reviewed the triage notes  Chief Complaint: Insect Bite   History of Present Illness: Shelly Tyler is a 50 y.o. female who was recently evaluated here for a skin lesion that was thought to be an infected bug bite that was on her chin. At that time she states the lesion and the note states it is exclusively located on the chin region. Patient has taken the Bactrim that was prescribed on an outpatient basis. She states the swelling and redness is only become more extensive and is now underneath her chin down toward her right side of her neck and up toward her jaw region. She has not had any trismus. She states she still able to speak and swallow normally but the pain is getting worse. She denies any fever at home. History reviewed. No pertinent past medical history.  Patient Active Problem List   Diagnosis Date Noted  . Cellulitis 02/26/2015    Past Surgical History  Procedure Laterality Date  . Cesarean section    . Bunionectomy    . Breast biopsy      Past Surgical History  Procedure Laterality Date  . Cesarean section    . Bunionectomy    . Breast biopsy      No current outpatient prescriptions on file.  Allergies:  Review of patient's allergies indicates no known allergies.  Family History: Family History  Problem Relation Age of Onset  . Diabetes type II Mother     Social History: Social History  Substance Use Topics  . Smoking status: Current Every Day Smoker    Types: Cigarettes  . Smokeless tobacco: Never Used  . Alcohol Use: No     Review of Systems:   10 point review of systems was performed and was otherwise negative:  Constitutional: No fever Eyes: No visual disturbances ENT: Pain and swelling underneath the chin and up toward the right side of the neck Cardiac: No chest pain Respiratory: No shortness of breath, wheezing, or stridor Abdomen: No abdominal pain, no vomiting, No diarrhea Endocrine: No weight  loss, No night sweats Extremities: No peripheral edema, cyanosis Skin: No rashes, easy bruising Neurologic: No focal weakness, trouble with speech or swollowing Urologic: No dysuria, Hematuria, or urinary frequency   Physical Exam:  ED Triage Vitals  Enc Vitals Group     BP 02/26/15 0835 118/84 mmHg     Pulse Rate 02/26/15 0835 86     Resp 02/26/15 0835 18     Temp 02/26/15 0835 98.7 F (37.1 C)     Temp Source 02/26/15 0835 Oral     SpO2 02/26/15 0835 96 %     Weight 02/26/15 0835 125 lb (56.7 kg)     Height 02/26/15 0835  (1.676 m)     Head Cir --      Peak Flow --      Pain Score 02/26/15 0905 9     Pain Loc --      Pain Edu? --      Excl. in GC? --     General: Awake , Alert , and Oriented times 3; GCS 15 Head: Normal cephalic , atraumatic Eyes: Pupils equal , round, reactive to light Nose/Throat: No nasal drainage, patent upper airway without erythema or exudate. Close examination of the chin shows a reddened raised area with a mid dark region. There is no obvious fluctuance or drainable lesion. Redness extends underneath the chin and there is pain and  induration extending below the chin and then toward the right mandible region. No sublingual lesion is noted Neck: Supple, Full range of motion, No anterior adenopathy or palpable thyroid masses no stridor Lungs: Clear to ascultation without wheezes , rhonchi, or rales Heart: Regular rate, regular rhythm without murmurs , gallops , or rubs Abdomen: Soft, non tender without rebound, guarding , or rigidity; bowel sounds positive and symmetric in all 4 quadrants. No organomegaly .        Extremities: 2 plus symmetric pulses. No edema, clubbing or cyanosis Neurologic: normal ambulation, Motor symmetric without deficits, sensory intact Skin: warm, dry, no rashes   Labs:   All laboratory work was reviewed including any pertinent negatives or positives listed below:  Labs Reviewed  CBC WITH DIFFERENTIAL/PLATELET -  Abnormal; Notable for the following:    WBC 14.1 (*)    Neutro Abs 11.4 (*)    Monocytes Absolute 1.0 (*)    All other components within normal limits  BASIC METABOLIC PANEL    Radiology:       EXAM: CT NECK WITH CONTRAST  TECHNIQUE: Multidetector CT imaging of the neck was performed using the standard protocol following the bolus administration of intravenous contrast.  CONTRAST: 75mL OMNIPAQUE IOHEXOL 300 MG/ML SOLN  COMPARISON: None.  FINDINGS: Soft tissue swelling anterior to the mandibular symphysis on the right. This shows soft tissue density. No abscess. No acute dental infection. No associated bony abnormality of the mandible. There is diffuse skin thickening in this area  Pharynx and larynx: Enlargement of the right tonsil with solid enhancement. No abscess. This could be asymmetric tonsillar hypertrophy due to infection versus tumor. Direct visualization recommended. Left tonsil normal. Airway normal. Epiglottis and larynx normal.  Salivary glands: Parotid and submandibular glands normal bilaterally. No mass or edema.  Thyroid: Normal  Lymph nodes: No pathologic adenopathy in the neck. Right level 2/ 3 lymph nodes 7.4 mm. 7 mm right submandibular lymph node.  Vascular: Negative  Limited intracranial: Negative  Visualized orbits: Negative  Mastoids and visualized paranasal sinuses: Negative  Skeleton: No fracture. No focal bony lesion. Facet degeneration and spurring on the left at C3-4.  Upper chest: Advanced apical emphysema. No apical lung mass.  IMPRESSION: Soft tissue swelling anterior to the mandible to the right of midline most consistent with cellulitis. No abscess.  Asymmetric enlargement right tonsil. Differential includes chronic infection versus tumor. ENT visualization of this area recommended.  Apical emphysema.  I personally reviewed the radiologic studies     ED Course: * Patient was started on IV antibiotic  therapy. Patient was started on doxycycline and Zosyn. Concern is that the lesion seems to be increasing after being on oral antibiotic therapy. The patient's airway does not seem to be at risk at this time. I felt the patient required further IV antibiotic therapy and observation. Patient's case was reviewed with the hospitalist team, further disposition and management depends upon their evaluation   Assessment:  Facial cellulitis  Final Clinical Impression:   Final diagnoses:  Cellulitis of face     Plan: * Inpatient            Jennye Moccasin, MD 02/26/15 1534

## 2015-02-26 NOTE — H&P (Signed)
[facility] - [place] at [facility]   PATIENT NAME: Shelly Tyler    MR#:  [id number]  DATE OF BIRTH:  September 23, 1965  DATE OF ADMISSION:  02/26/2015  PRIMARY CARE PHYSICIAN: Vonita Moss, MD   REQUESTING/REFERRING PHYSICIAN: Dr.  Huel Cote  CHIEF COMPLAINT:  Pain redness and swelling over the chin 3 days  HISTORY OF PRESENT ILLNESS:  Shelly Tyler  is a 50 y.o. female with no past medical history comes to the emergency room after she was seen 2 days ago in the ER for right chin cellulitis. Patient was prescribed oxycodone and Bactrim which she was taking. Her symptoms got worse with increasing tenderness, redness and came to the emergency room for further nausea management. Her white count is 14,000. Patient is being admitted for right chin cellulitis. Patient denies any insect bite to her knowledge. Patient received IV doxycycline the emergency room at a dose of IV Zosyn. She is being admitted for further evaluation management  PAST MEDICAL HISTORY:  History reviewed. No pertinent past medical history.  PAST SURGICAL HISTOIRY:   Past Surgical History  Procedure Laterality Date  . Cesarean section    . Bunionectomy    . Breast biopsy      SOCIAL HISTORY:   Social History  Substance Use Topics  . Smoking status: Current Every Day Smoker    Types: Cigarettes  . Smokeless tobacco: Never Used  . Alcohol Use: No    FAMILY HISTORY:   Family History  Problem Relation Age of Onset  . Diabetes type II Mother     DRUG ALLERGIES:  No Known Allergies  REVIEW OF SYSTEMS:  Review of Systems  Constitutional: Negative for fever, chills and weight loss.  HENT: Negative for ear discharge, ear pain and nosebleeds.   Eyes: Negative for blurred vision, pain and discharge.  Respiratory: Negative for sputum production, shortness of breath, wheezing and stridor.   Cardiovascular: Negative for chest pain, palpitations, orthopnea and PND.  Gastrointestinal:  Negative for nausea, vomiting, abdominal pain and diarrhea.  Genitourinary: Negative for urgency and frequency.  Musculoskeletal: Negative for back pain and joint pain.  Skin:       Right chin  cellulitis  Neurological: Negative for sensory change, speech change, focal weakness and weakness.  Psychiatric/Behavioral: Negative for depression and hallucinations. The patient is not nervous/anxious.      MEDICATIONS AT HOME:   Prior to Admission medications   Medication Sig Start Date End Date Taking? Authorizing Provider  Cholecalciferol (VITAMIN D3) 2000 units TABS Take 2,000 Units by mouth daily as needed (for Vitamin D.).   Yes Historical Provider, MD  Multiple Vitamin (MULTIVITAMIN) tablet Take 1 tablet by mouth daily.   Yes Historical Provider, MD  oxyCODONE-acetaminophen (ROXICET) 5-325 MG tablet Take 1 tablet by mouth every 4 (four) hours as needed for severe pain. 02/23/15  Yes Joni Reining, PA-C  sulfamethoxazole-trimethoprim (BACTRIM DS,SEPTRA DS) 800-160 MG tablet Take 1 tablet by mouth 2 (two) times daily. 02/23/15  Yes Joni Reining, PA-C  meloxicam (MOBIC) 15 MG tablet Take 1 tablet (15 mg total) by mouth daily. Patient not taking: Reported on 02/26/2015 02/19/15   Delorise Royals Cuthriell, PA-C  naproxen (NAPROSYN) 250 MG tablet Take 1 tablet (250 mg total) by mouth 2 (two) times daily with a meal. 08/14/14   Sharman Cheek, MD  oxyCODONE (ROXICODONE) 5 MG immediate release tablet Take 1 tablet (5 mg total) by mouth every 6 (six) hours as needed for breakthrough pain. 08/14/14  Sharman Cheek, MD      VITAL SIGNS:  Blood pressure 126/83, pulse 76, temperature 98.7 F (37.1 C), temperature source Oral, resp. rate 16, height 5\' 6"  (1.676 m), weight 56.7 kg (125 lb), SpO2 99 %.  PHYSICAL EXAMINATION:  GENERAL:  50 y.o.-year-old patient lying in the bed with no acute distress.  EYES: Pupils equal, round, reactive to light and accommodation. No scleral icterus. Extraocular muscles  intact.  HEENT: Head atraumatic, normocephalic. Oropharynx and nasopharynx clear.  NECK:  Supple, no jugular venous distention. No thyroid enlargement, no tenderness. Right cheek cellulitis with tenderness. No cervical lymphadenopathy felt. LUNGS: Normal breath sounds bilaterally, no wheezing, rales,rhonchi or crepitation. No use of accessory muscles of respiration.  CARDIOVASCULAR: S1, S2 normal. No murmurs, rubs, or gallops.  ABDOMEN: Soft, nontender, nondistended. Bowel sounds present. No organomegaly or mass.  EXTREMITIES: No pedal edema, cyanosis, or clubbing.  NEUROLOGIC: Cranial nerves II through XII are intact. Muscle strength 5/5 in all extremities. Sensation intact. Gait not checked.  PSYCHIATRIC: The patient is alert and oriented x 3.  SKIN: No obvious rash, lesion, or ulcer.   LABORATORY PANEL:   CBC  Recent Labs Lab 02/26/15 1031  WBC 14.1*  HGB 13.3  HCT 38.4  PLT 401   ------------------------------------------------------------------------------------------------------------------  Chemistries   Recent Labs Lab 02/26/15 1031  NA 136  K 4.3  CL 102  CO2 25  GLUCOSE 95  BUN 14  CREATININE 0.77  CALCIUM 10.3   ------------------------------------------------------------------------------------------------------------------  Cardiac Enzymes No results for input(s): TROPONINI in the last 168 hours. ------------------------------------------------------------------------------------------------------------------  RADIOLOGY:  Ct Soft Tissue Neck W Contrast  02/26/2015  CLINICAL DATA:  Soft tissue swelling chin. Rule out abscess. On antibiotics. EXAM: CT NECK WITH CONTRAST TECHNIQUE: Multidetector CT imaging of the neck was performed using the standard protocol following the bolus administration of intravenous contrast. CONTRAST:  75mL OMNIPAQUE IOHEXOL 300 MG/ML  SOLN COMPARISON:  None. FINDINGS: Soft tissue swelling anterior to the mandibular symphysis on the  right. This shows soft tissue density. No abscess. No acute dental infection. No associated bony abnormality of the mandible. There is diffuse skin thickening in this area Pharynx and larynx: Enlargement of the right tonsil with solid enhancement. No abscess. This could be asymmetric tonsillar hypertrophy due to infection versus tumor. Direct visualization recommended. Left tonsil normal. Airway normal. Epiglottis and larynx normal. Salivary glands: Parotid and submandibular glands normal bilaterally. No mass or edema. Thyroid: Normal Lymph nodes: No pathologic adenopathy in the neck. Right level 2/ 3 lymph nodes 7.4 mm. 7 mm right submandibular lymph node. Vascular: Negative Limited intracranial: Negative Visualized orbits: Negative Mastoids and visualized paranasal sinuses: Negative Skeleton: No fracture. No focal bony lesion. Facet degeneration and spurring on the left at C3-4. Upper chest: Advanced apical emphysema.  No apical lung mass. IMPRESSION: Soft tissue swelling anterior to the mandible to the right of midline most consistent with cellulitis. No abscess. Asymmetric enlargement right tonsil. Differential includes chronic infection versus tumor. ENT visualization of this area recommended. Apical emphysema. Electronically Signed   By: Marlan Palau M.D.   On: 02/26/2015 11:29    EKG:    IMPRESSION AND PLAN:   Shelly Tyler  is a 50 y.o. female with no past medical history comes to the emergency room after she was seen 2 days ago in the ER for right chin cellulitis. Patient was prescribed oxycodone and Bactrim which she was taking. Her symptoms got worse with increasing tenderness, redness and came to the emergency room for  further nausea management. Her white count is 14,000  1. Right chin cellulitis -Admit to medical floor -IV cefazolin 1 g every 8 hourly -Monitor blood culture and white count -CT of the neck does not show any abscess. Consider surgical consultation if signs and symptoms  worsen -When necessary pain meds  2. Right tonsillar enlargement as noted on CT scan Patient advised ENT appointment as outpatient  3. Tobacco abuse smoking cessation advice 3 minutes spent  4. DVT prophylaxis subcutaneous Lovenox  All the records are reviewed and case discussed with ED provider. Management plans discussed with the patient, family and they are in agreement.  CODE STATUS: Full  TOTAL TIME TAKING CARE OF THIS PATIENT: 45 minutes.    Yaneisy Wenz M.D on 02/26/2015 at 12:32 PM  Between 7am to 6pm - Pager - [phone]  After 6pm go to [email or web address] - password [email or web address] [facility]  [place] [facility]  Office  [phone]  CC: Primary care physician; Vonita Moss, MD

## 2015-02-26 NOTE — ED Notes (Signed)
Pt states was seen here Thursday and dx with an infected bug bite to her chin, pt states they give her Bactrim. Pt returns today with worsening symptoms. Pt presents with redness and swelling to her chin, pt states using hot compresses without relief. Redness and swelling noted to extend down her chin. Pt states pain when moving her jaw, denies problems swallowing at this time.

## 2015-02-26 NOTE — Progress Notes (Signed)
Notified Dr Elpidio Anis of pt's complaint of not getting relief from pain medication order and refusal of lovenox

## 2015-02-27 LAB — CBC
HCT: 36.4 % (ref 35.0–47.0)
HEMOGLOBIN: 12.4 g/dL (ref 12.0–16.0)
MCH: 32.8 pg (ref 26.0–34.0)
MCHC: 34.1 g/dL (ref 32.0–36.0)
MCV: 96.1 fL (ref 80.0–100.0)
PLATELETS: 386 10*3/uL (ref 150–440)
RBC: 3.79 MIL/uL — AB (ref 3.80–5.20)
RDW: 12.9 % (ref 11.5–14.5)
WBC: 12.2 10*3/uL — ABNORMAL HIGH (ref 3.6–11.0)

## 2015-02-27 MED ORDER — HYDROMORPHONE HCL 1 MG/ML IJ SOLN
2.0000 mg | INTRAMUSCULAR | Status: DC | PRN
  Administered 2015-02-27 – 2015-02-28 (×4): 2 mg via INTRAVENOUS
  Filled 2015-02-27 (×4): qty 2

## 2015-02-27 MED ORDER — OXYCODONE HCL 5 MG PO TABS
10.0000 mg | ORAL_TABLET | ORAL | Status: DC | PRN
  Administered 2015-02-27 – 2015-02-28 (×3): 10 mg via ORAL
  Filled 2015-02-27 (×3): qty 2

## 2015-02-27 NOTE — Progress Notes (Signed)
[facility] -  at [facility]   PATIENT NAME: Shelly Tyler    MR#:  [id number]  DATE OF BIRTH:  [birth year]  SUBJECTIVE:    REVIEW OF SYSTEMS:   ROS Tolerating Diet: Tolerating PT:   DRUG ALLERGIES:  No Known Allergies  VITALS:  Blood pressure 120/62, pulse 69, temperature 98.6 F (37 C), temperature source Oral, resp. rate 17, height  (1.702 m), weight 60.102 kg (132 lb 8 oz), SpO2 96 %.  PHYSICAL EXAMINATION:   Physical Exam  GENERAL:  50 y.o.-year-old patient lying in the bed with no acute distress.  EYES: Pupils equal, round, reactive to light and accommodation. No scleral icterus. Extraocular muscles intact.  HEENT: Head atraumatic, normocephalic. Oropharynx and nasopharynx clear.  NECK:  Supple, no jugular venous distention. No thyroid enlargement, no tenderness.  LUNGS: Normal breath sounds bilaterally, no wheezing, rales, rhonchi. No use of accessory muscles of respiration.  CARDIOVASCULAR: S1, S2 normal. No murmurs, rubs, or gallops.  ABDOMEN: Soft, nontender, nondistended. Bowel sounds present. No organomegaly or mass.  EXTREMITIES: No cyanosis, clubbing or edema b/l.    NEUROLOGIC: Cranial nerves II through XII are intact. No focal Motor or sensory deficits b/l.   PSYCHIATRIC:  patient is alert and oriented x 3.  SKIN: No obvious rash, lesion, or ulcer.   LABORATORY PANEL:  CBC  Recent Labs Lab 02/27/15 0414  WBC 12.2*  HGB 12.4  HCT 36.4  PLT 386    Chemistries   Recent Labs Lab 02/26/15 1031  NA 136  K 4.3  CL 102  CO2 25  GLUCOSE 95  BUN 14  CREATININE 0.77  CALCIUM 10.3   Cardiac Enzymes No results for input(s): TROPONINI in the last 168 hours. RADIOLOGY:  Ct Soft Tissue Neck W Contrast  02/26/2015  CLINICAL DATA:  Soft tissue swelling chin. Rule out abscess. On antibiotics. EXAM: CT NECK WITH CONTRAST TECHNIQUE: Multidetector CT imaging of the neck was performed using the standard protocol  following the bolus administration of intravenous contrast. CONTRAST:  75mL OMNIPAQUE IOHEXOL 300 MG/ML  SOLN COMPARISON:  None. FINDINGS: Soft tissue swelling anterior to the mandibular symphysis on the right. This shows soft tissue density. No abscess. No acute dental infection. No associated bony abnormality of the mandible. There is diffuse skin thickening in this area Pharynx and larynx: Enlargement of the right tonsil with solid enhancement. No abscess. This could be asymmetric tonsillar hypertrophy due to infection versus tumor. Direct visualization recommended. Left tonsil normal. Airway normal. Epiglottis and larynx normal. Salivary glands: Parotid and submandibular glands normal bilaterally. No mass or edema. Thyroid: Normal Lymph nodes: No pathologic adenopathy in the neck. Right level 2/ 3 lymph nodes 7.4 mm. 7 mm right submandibular lymph node. Vascular: Negative Limited intracranial: Negative Visualized orbits: Negative Mastoids and visualized paranasal sinuses: Negative Skeleton: No fracture. No focal bony lesion. Facet degeneration and spurring on the left at C3-4. Upper chest: Advanced apical emphysema.  No apical lung mass. IMPRESSION: Soft tissue swelling anterior to the mandible to the right of midline most consistent with cellulitis. No abscess. Asymmetric enlargement right tonsil. Differential includes chronic infection versus tumor. ENT visualization of this area recommended. Apical emphysema. Electronically Signed   By: Marlan Palau M.D.   On: 02/26/2015 11:29   ASSESSMENT AND PLAN:   Shelly Tyler is a 50 y.o. female with no past medical history comes to the emergency room after she was seen 2 days ago in the ER for right chin cellulitis.  Patient was prescribed oxycodone and Bactrim which she was taking. Her symptoms got worse with increasing tenderness, redness and came to the emergency room for further nausea management. Her white count is 14,000  1. Right chin cellulitis with  possible abscess -IV cefazolin 1 g every 8 hourly - white count down to 12 K (14K) -CT of the neck does not show any abscess. -will get surgical consultation since no improvement of signs and symptoms  -When necessary pain meds -will keep pt NPO incase she needs I and D  2. Right tonsillar enlargement as noted on CT scan Patient advised ENT appointment as outpatient  3. Tobacco abuse smoking cessation advice 3 minutes spent  4. DVT prophylaxis subcutaneous Lovenox  Case discussed with Care Management/Social Worker. Management plans discussed with the patient, family and they are in agreement.  CODE STATUS: full  DVT Prophylaxis: lovenox  TOTAL TIME TAKING CARE OF THIS PATIENT: 35 minutes.  >50% time spent on counselling and coordination of care  POSSIBLE D/C IN 1-2DAYS, DEPENDING ON CLINICAL CONDITION.  Note: This dictation was prepared with Dragon dictation along with smaller phrase technology. Any transcriptional errors that result from this process are unintentional.  Shelly Tyler M.D on 02/27/2015 at 9:43 AM  Between 7am to 6pm - Pager - [phone]  After 6pm go to [email or web address] - password [email or web address] [facility]  [place] [facility]  Office  [phone]  CC: Primary care physician; Shelly Moss, MD

## 2015-02-27 NOTE — Care Management Important Message (Signed)
Important Message  Patient Details  Name: Shelly Tyler MRN: [id number] Date of Birth: [birth year]   Medicare Important Message Given:  Yes    Helaine Yackel A, RN 02/27/2015, 8:12 AM

## 2015-02-27 NOTE — Progress Notes (Signed)
MD patel requested that the pt be NPO until she is seen by Dr Orvis Brill

## 2015-02-27 NOTE — Consult Note (Signed)
Shelly Tyler, Brissette [id number] [birth year] Shelly Finner, MD  Reason for Consult: Evaluate chin cellulitis for possible drainage  HPI: Patient is a 50 year old white female who had noted a little bit of swelling and tenderness around anterior chin couple of days ago. She presented the emergency room and was given Bactrim.  her symptoms seem to worsen over couple days and she presented back to the emergency room and was given IV doxycycline and IV Zosyn. She is admitted the last night and has been on IV cefazolin overnight.  she had some cellulitis and swelling that he going down into her chin which now seems like it settled down a little bit. Her white count was 14,000. She is developed scab over the chin as well.  Allergies: No Known Allergies  ROS: Review of systems normal other than 12 systems except per HPI.  PMH: History reviewed. No pertinent past medical history.  FH:  Family History  Problem Relation Age of Onset  . Diabetes type II Mother     SH:  Social History   Social History  . Marital Status: Married    Spouse Name: Shelly Tyler  . Number of Children: Shelly Tyler  . Years of Education: Shelly Tyler   Occupational History  . Not on file.   Social History Main Topics  . Smoking status: Current Every Day Smoker    Types: Cigarettes  . Smokeless tobacco: Never Used  . Alcohol Use: No  . Drug Use: No  . Sexual Activity: Not on file   Other Topics Concern  . Not on file   Social History Narrative    PSH:  Past Surgical History  Procedure Laterality Date  . Cesarean section    . Bunionectomy    . Breast biopsy      Physical  Exam: Well-developed well-nourished white female in no acute distress. CN 2-12 grossly intact and symmetric.  Oral cavity, lips, gums, ororpharynx normal with no masses or lesions. Her right tonsil is just slightly bigger than her left but both looked normal and pink without any signs of infection or lesions. Nasal cavity without polyps or purulence. External nose and  ears without masses or lesions. Her anterior chin is pink and swollen has a scab on it. I removed the scab and found a small opening. I cultured this. There was no significant drainage. Neck supple with no masses or lesions. No lymphadenopathy palpated. Thyroid normal with no masses. Her CT scan was reviewed which showed no evidence of abscess in the neck  A/P: She has cellulitis for chin that is most likely MRSA. The IV doxycycline that she had is more appropriate than the IV Cefazolin right now. Will add gentamicin ointment over the chin. She was instructed remove the scab in the morning again to make sure that this would drain properly. She'll be put on precautions for staph in her room. This should now settle down with appropriate medications, and if so, can then be discharged home on oral antibiotics to cover MRSA and gentamicin ointment to the skin. Return to clinic only if this does not resolve. If she should worsen tomorrow we can consider further opening of the wound if necessary.   [name] H 02/27/2015 6:54 PM

## 2015-02-27 NOTE — Progress Notes (Signed)
Dr Orvis Brill called to say that she felt this consult should go to ENT.  Dr Allena Katz notified.  ENT consult ordered.  Soft diet ordered

## 2015-02-28 MED ORDER — HYDROMORPHONE HCL 2 MG PO TABS: 2.0000 mg | ORAL_TABLET | ORAL | Status: DC | PRN

## 2015-02-28 MED ORDER — SULFAMETHOXAZOLE-TRIMETHOPRIM 800-160 MG PO TABS
1.0000 | ORAL_TABLET | Freq: Two times a day (BID) | ORAL | Status: DC
  Administered 2015-02-28: 1 via ORAL
  Filled 2015-02-28: qty 1

## 2015-02-28 MED ORDER — BENZOCAINE-MENTHOL 20-0.5 % EX AERO
1.0000 "application " | INHALATION_SPRAY | Freq: Once | CUTANEOUS | Status: AC
  Administered 2015-02-28: 1 via TOPICAL
  Filled 2015-02-28: qty 56

## 2015-02-28 MED ORDER — GENTAMICIN SULFATE 0.1 % EX OINT
TOPICAL_OINTMENT | Freq: Three times a day (TID) | CUTANEOUS | Status: DC
  Administered 2015-02-28: 09:00:00 via TOPICAL
  Filled 2015-02-28: qty 15

## 2015-02-28 MED ORDER — GENTAMICIN SULFATE 0.1 % EX OINT
TOPICAL_OINTMENT | Freq: Three times a day (TID) | CUTANEOUS | Status: DC
Start: 2015-02-28 — End: 2015-04-06

## 2015-02-28 NOTE — Discharge Summary (Signed)
[facility] [place] at [facility]   PATIENT NAME: Shelly Tyler    MR#:  [id number]  DATE OF BIRTH:  11-Aug-1965  DATE OF ADMISSION:  02/26/2015 ADMITTING PHYSICIAN: Enedina Finner, MD  DATE OF DISCHARGE: 02/28/2015  PRIMARY CARE PHYSICIAN: Vonita Moss, MD    ADMISSION DIAGNOSIS:  Cellulitis of face [L03.211]  DISCHARGE DIAGNOSIS:  Cellulitis of her right chin status post superficial debridement at bedside  SECONDARY DIAGNOSIS:  History reviewed. No pertinent past medical history.  HOSPITAL COURSE:   Shelly Tyler is a 51 y.o. female with no past medical history comes to the emergency room after she was seen 2 days ago in the ER for right chin cellulitis. Patient was prescribed oxycodone and Bactrim which she was taking. Her symptoms got worse with increasing tenderness, redness and came to the emergency room for further nausea management. Her white count is 14,000  1. Right chin cellulitis status post superficial debridement at bedside by me after discussion with Dr.juengle ENT -IV cefazolin 1 g every 8 hourly--- changed to by mouth Bactrim to cover MRSA - white count down to 12 K (14K) -CT of the neck does not show any abscess. -Local gentamicin ointment given to patient to apply 3 times a day she'll follow up with ENT as outpatient  -Patient advised to take ibuprofen 600 mg 3 times a day for next 3-4 days and then as needed  2. Right tonsillar enlargement as noted on CT scan Patient advised ENT appointment as outpatient  3. Tobacco abuse smoking cessation advice 3 minutes spent  4. DVT prophylaxis subcutaneous Lovenox  Overall doing okay she will be discharged home.  CONSULTS OBTAINED:  Treatment Team:  Vernie Murders, MD  DRUG ALLERGIES:  No Known Allergies  DISCHARGE MEDICATIONS:   Current Discharge Medication List    START taking these medications   Details  gentamicin ointment (GARAMYCIN) 0.1 % Apply topically 3 (three) times  daily. Qty: 15 g, Refills: 0    HYDROmorphone (DILAUDID) 2 MG tablet Take 1 tablet (2 mg total) by mouth every 4 (four) hours as needed for severe pain. Qty: 30 tablet, Refills: 0      CONTINUE these medications which have NOT CHANGED   Details  Cholecalciferol (VITAMIN D3) 2000 units TABS Take 2,000 Units by mouth daily as needed (for Vitamin D.).    Multiple Vitamin (MULTIVITAMIN) tablet Take 1 tablet by mouth daily.    sulfamethoxazole-trimethoprim (BACTRIM DS,SEPTRA DS) 800-160 MG tablet Take 1 tablet by mouth 2 (two) times daily. Qty: 20 tablet, Refills: 0    meloxicam (MOBIC) 15 MG tablet Take 1 tablet (15 mg total) by mouth daily. Qty: 30 tablet, Refills: 0    naproxen (NAPROSYN) 250 MG tablet Take 1 tablet (250 mg total) by mouth 2 (two) times daily with a meal. Qty: 40 tablet, Refills: 0      STOP taking these medications     oxyCODONE-acetaminophen (ROXICET) 5-325 MG tablet      oxyCODONE (ROXICODONE) 5 MG immediate release tablet         If you experience worsening of your admission symptoms, develop shortness of breath, life threatening emergency, suicidal or homicidal thoughts you must seek medical attention immediately by calling 911 or calling your MD immediately  if symptoms less severe.  You Must read complete instructions/literature along with all the possible adverse reactions/side effects for all the Medicines you take and that have been prescribed to you. Take any new Medicines after you have completely  understood and accept all the possible adverse reactions/side effects.   Please note  You were cared for by a hospitalist during your hospital stay. If you have any questions about your discharge medications or the care you received while you were in the hospital after you are discharged, you can call the unit and asked to speak with the hospitalist on call if the hospitalist that took care of you is not available. Once you are discharged, your primary care  physician will handle any further medical issues. Please note that NO REFILLS for any discharge medications will be authorized once you are discharged, as it is imperative that you return to your primary care physician (or establish a relationship with a primary care physician if you do not have one) for your aftercare needs so that they can reassess your need for medications and monitor your lab values. Today   SUBJECTIVE   Feels pain at the right chin. No fever. Able to swallow food.  VITAL SIGNS:  Blood pressure 120/68, pulse 74, temperature 97.9 F (36.6 C), temperature source Oral, resp. rate 18, height  (1.702 m), weight 60.102 kg (132 lb 8 oz), SpO2 96 %.  I/O:    Intake/Output Summary (Last 24 hours) at 02/28/15 0903 Last data filed at 02/27/15 1818  Gross per 24 hour  Intake      0 ml  Output      0 ml  Net      0 ml    PHYSICAL EXAMINATION:  GENERAL:  50 y.o.-year-old patient lying in the bed with no acute distress.  EYES: Pupils equal, round, reactive to light and accommodation. No scleral icterus. Extraocular muscles intact.  HEENT: Head atraumatic, normocephalic. Oropharynx and nasopharynx clear. Right chin cellulitis NECK:  Supple, no jugular venous distention. No thyroid enlargement, no tenderness.  LUNGS: Normal breath sounds bilaterally, no wheezing, rales,rhonchi or crepitation. No use of accessory muscles of respiration.  CARDIOVASCULAR: S1, S2 normal. No murmurs, rubs, or gallops.  ABDOMEN: Soft, non-tender, non-distended. Bowel sounds present. No organomegaly or mass.  EXTREMITIES: No pedal edema, cyanosis, or clubbing.  NEUROLOGIC: Cranial nerves II through XII are intact. Muscle strength 5/5 in all extremities. Sensation intact. Gait not checked.  PSYCHIATRIC: The patient is alert and oriented x 3.  SKIN: No obvious rash, lesion, or ulcer.   DATA REVIEW:   CBC   Recent Labs Lab 02/27/15 0414  WBC 12.2*  HGB 12.4  HCT 36.4  PLT 386     Chemistries   Recent Labs Lab 02/26/15 1031  NA 136  K 4.3  CL 102  CO2 25  GLUCOSE 95  BUN 14  CREATININE 0.77  CALCIUM 10.3    Microbiology Results   Recent Results (from the past 240 hour(s))  Wound culture     Status: None (Preliminary result)   Collection Time: 02/27/15  7:13 PM  Result Value Ref Range Status   Specimen Description ABSCESS  Final   Special Requests Normal  Final   Gram Stain PENDING  Incomplete   Culture HOLDING FOR POSSIBLE PATHOGEN  Final   Report Status PENDING  Incomplete    RADIOLOGY:  Ct Soft Tissue Neck W Contrast  02/26/2015  CLINICAL DATA:  Soft tissue swelling chin. Rule out abscess. On antibiotics. EXAM: CT NECK WITH CONTRAST TECHNIQUE: Multidetector CT imaging of the neck was performed using the standard protocol following the bolus administration of intravenous contrast. CONTRAST:  75mL OMNIPAQUE IOHEXOL 300 MG/ML  SOLN COMPARISON:  None.  FINDINGS: Soft tissue swelling anterior to the mandibular symphysis on the right. This shows soft tissue density. No abscess. No acute dental infection. No associated bony abnormality of the mandible. There is diffuse skin thickening in this area Pharynx and larynx: Enlargement of the right tonsil with solid enhancement. No abscess. This could be asymmetric tonsillar hypertrophy due to infection versus tumor. Direct visualization recommended. Left tonsil normal. Airway normal. Epiglottis and larynx normal. Salivary glands: Parotid and submandibular glands normal bilaterally. No mass or edema. Thyroid: Normal Lymph nodes: No pathologic adenopathy in the neck. Right level 2/ 3 lymph nodes 7.4 mm. 7 mm right submandibular lymph node. Vascular: Negative Limited intracranial: Negative Visualized orbits: Negative Mastoids and visualized paranasal sinuses: Negative Skeleton: No fracture. No focal bony lesion. Facet degeneration and spurring on the left at C3-4. Upper chest: Advanced apical emphysema.  No apical lung  mass. IMPRESSION: Soft tissue swelling anterior to the mandible to the right of midline most consistent with cellulitis. No abscess. Asymmetric enlargement right tonsil. Differential includes chronic infection versus tumor. ENT visualization of this area recommended. Apical emphysema. Electronically Signed   By: Marlan Palau M.D.   On: 02/26/2015 11:29     Management plans discussed with the patient, family and they are in agreement.  CODE STATUS:     Code Status Orders        Start     Ordered   02/26/15 1418  Full code   Continuous     02/26/15 1417    Code Status History    Date Active Date Inactive Code Status Order ID Comments User Context   This patient has a current code status but no historical code status.      TOTAL TIME TAKING CARE OF THIS PATIENT: 40  minutes.    Yoshika Vensel M.D on 02/28/2015 at 9:03 AM  Between 7am to 6pm - Pager - [phone] After 6pm go to [email or web address] - password [email or web address] [facility]  [facility]  Office  [phone]  CC: Primary care physician; Vonita Moss, MD

## 2015-02-28 NOTE — Progress Notes (Signed)
[facility]    Shelly Tyler was admitted to the Hospital on 02/26/2015 and Discharged  02/28/2015 and should be excused from work/school   for 5 days starting 02/26/2015 , may return to work/school without any restrictions.return to work Thursday feb 23 rd  Call Shelly Finner MD, [facility]  [phone] with questions.  [name] M.D on 02/28/2015,at 8:54 AM

## 2015-02-28 NOTE — Progress Notes (Signed)
Discharge instructions/prescriptions given; acknowledged understanding. 

## 2015-02-28 NOTE — Discharge Instructions (Signed)
Apply gentamicin ointment 3 times a day on your chin and dressing per instructions.

## 2015-03-03 DIAGNOSIS — Z720 Tobacco use: Secondary | ICD-10-CM

## 2015-03-03 DIAGNOSIS — F17210 Nicotine dependence, cigarettes, uncomplicated: Secondary | ICD-10-CM | POA: Insufficient documentation

## 2015-03-03 DIAGNOSIS — F329 Major depressive disorder, single episode, unspecified: Secondary | ICD-10-CM

## 2015-03-03 DIAGNOSIS — F32A Depression, unspecified: Secondary | ICD-10-CM | POA: Insufficient documentation

## 2015-03-03 DIAGNOSIS — F419 Anxiety disorder, unspecified: Secondary | ICD-10-CM | POA: Insufficient documentation

## 2015-03-05 LAB — WOUND CULTURE: SPECIAL REQUESTS: NORMAL

## 2015-03-06 ENCOUNTER — Encounter: Payer: Self-pay | Admitting: Unknown Physician Specialty

## 2015-03-06 ENCOUNTER — Inpatient Hospital Stay: Payer: Self-pay | Admitting: Unknown Physician Specialty

## 2015-04-06 ENCOUNTER — Ambulatory Visit (INDEPENDENT_AMBULATORY_CARE_PROVIDER_SITE_OTHER): Payer: BLUE CROSS/BLUE SHIELD | Admitting: Family Medicine

## 2015-04-06 ENCOUNTER — Encounter: Payer: Self-pay | Admitting: Family Medicine

## 2015-04-06 VITALS — BP 139/82 | HR 73 | Temp 98.5°F | Ht 64.2 in | Wt 135.0 lb

## 2015-04-06 DIAGNOSIS — B359 Dermatophytosis, unspecified: Secondary | ICD-10-CM

## 2015-04-06 DIAGNOSIS — Z113 Encounter for screening for infections with a predominantly sexual mode of transmission: Secondary | ICD-10-CM | POA: Diagnosis not present

## 2015-04-06 MED ORDER — NYSTATIN-TRIAMCINOLONE 100000-0.1 UNIT/GM-% EX OINT: 1.0000 "application " | TOPICAL_OINTMENT | Freq: Two times a day (BID) | CUTANEOUS | Status: DC

## 2015-04-06 MED ORDER — KETOCONAZOLE 2 % EX CREA: 1.0000 "application " | TOPICAL_CREAM | Freq: Every day | CUTANEOUS | Status: DC

## 2015-04-06 NOTE — Progress Notes (Signed)
BP 139/82 mmHg  Pulse 73  Temp(Src) 98.5 F (36.9 C)  Ht 5' 4.2" (1.631 m)  Wt 135 lb (61.236 kg)  BMI 23.02 kg/m2  SpO2 99%   Subjective:    Patient ID: Shelly Tyler, female    DOB: [birth year], 50 y.o.   MRN: [id number]  HPI: Shelly Tyler is a 50 y.o. female  Chief Complaint  Patient presents with  . Rash  . Blood work    Patient would like to [employer] all her labs checked   RASH Duration:  About a month  Location: L hand  Itching: yes Burning: no Redness: yes Oozing: no Scaling: yes Blisters: no Painful: no Fevers: no Change in detergents/soaps/personal care products: no Recent illness: no Recent travel:no History of same: no Context: worse Alleviating factors: nothing Treatments attempted:hydrocortisone cream Shortness of breath: no  Throat/tongue swelling: no Myalgias/arthralgias: no  Relevant past medical, surgical, family and social history reviewed and updated as indicated. Interim medical history since our last visit reviewed. Allergies and medications reviewed and updated.  Review of Systems  Constitutional: Negative.   Respiratory: Negative.   Cardiovascular: Negative.   Psychiatric/Behavioral: Negative for suicidal ideas, hallucinations, behavioral problems, confusion, sleep disturbance, self-injury, dysphoric mood, decreased concentration and agitation. The patient is nervous/anxious. The patient is not hyperactive.     Per HPI unless specifically indicated above     Objective:    BP 139/82 mmHg  Pulse 73  Temp(Src) 98.5 F (36.9 C)  Ht 5' 4.2" (1.631 m)  Wt 135 lb (61.236 kg)  BMI 23.02 kg/m2  SpO2 99%  Wt Readings from Last 3 Encounters:  04/06/15 135 lb (61.236 kg)  02/23/14 134 lb (60.782 kg)  02/26/15 132 lb 8 oz (60.102 kg)    Physical Exam  Constitutional: She is oriented to person, place, and time. She appears well-developed and well-nourished. No distress.  HENT:  Head: Normocephalic and atraumatic.  Right Ear: Hearing  normal.  Left Ear: Hearing normal.  Nose: Nose normal.  Eyes: Conjunctivae and lids are normal. Right eye exhibits no discharge. Left eye exhibits no discharge. No scleral icterus.  Pulmonary/Chest: Effort normal. No respiratory distress.  Musculoskeletal: Normal range of motion.  Neurological: She is alert and oriented to person, place, and time.  Skin: Skin is warm, dry and intact. Rash (excoriated 1 inch pustular rash on dorsum of L hand with central clearing) noted. No erythema. No pallor.  Psychiatric: She has a normal mood and affect. Her speech is normal and behavior is normal. Judgment and thought content normal. Cognition and memory are normal.  Nursing note and vitals reviewed.   Results for orders placed or performed in visit on 03/03/15  HM MAMMOGRAPHY  Result Value Ref Range   HM Mammogram from PP   HM PAP SMEAR  Result Value Ref Range   HM Pap smear from PP- negative   HM COLONOSCOPY  Result Value Ref Range   HM Colonoscopy from PP       Assessment & Plan:   Problem List Items Addressed This Visit    None    Visit Diagnoses    Ringworm    -  Primary    WIll treat with ketoconazole. Call if not getting better or getting worse. Will get her in for full physical ASAP.     Relevant Medications    ketoconazole (NIZORAL) 2 % cream    Screening for STD (sexually transmitted disease)        Will draw labs  at physical to save her a stick. Will return ASAP for full physical.    Relevant Orders    Hepatitis C Antibody    HIV antibody        Follow up plan: Return ASAP , for physical.

## 2015-04-14 ENCOUNTER — Other Ambulatory Visit: Payer: Self-pay | Admitting: Family Medicine

## 2015-04-14 ENCOUNTER — Encounter: Payer: BLUE CROSS/BLUE SHIELD | Admitting: Family Medicine

## 2015-04-14 DIAGNOSIS — Z720 Tobacco use: Secondary | ICD-10-CM

## 2015-04-14 DIAGNOSIS — Z0000 Encounter for general adult medical examination without abnormal findings: Secondary | ICD-10-CM

## 2015-04-14 DIAGNOSIS — Z114 Encounter for screening for human immunodeficiency virus [HIV]: Secondary | ICD-10-CM

## 2015-04-14 DIAGNOSIS — Z1159 Encounter for screening for other viral diseases: Secondary | ICD-10-CM

## 2015-04-14 DIAGNOSIS — Z13220 Encounter for screening for lipoid disorders: Secondary | ICD-10-CM

## 2015-06-06 ENCOUNTER — Inpatient Hospital Stay
Admission: EM | Admit: 2015-06-06 | Discharge: 2015-06-08 | DRG: 872 | Disposition: A | Discharge status: Home / Self Care | Payer: BLUE CROSS/BLUE SHIELD | Attending: Internal Medicine | Admitting: Internal Medicine

## 2015-06-06 ENCOUNTER — Encounter: Payer: Self-pay | Admitting: Emergency Medicine

## 2015-06-06 ENCOUNTER — Emergency Department: Payer: BLUE CROSS/BLUE SHIELD

## 2015-06-06 DIAGNOSIS — Z8249 Family history of ischemic heart disease and other diseases of the circulatory system: Secondary | ICD-10-CM | POA: Diagnosis not present

## 2015-06-06 DIAGNOSIS — Z79899 Other long term (current) drug therapy: Secondary | ICD-10-CM

## 2015-06-06 DIAGNOSIS — R109 Unspecified abdominal pain: Secondary | ICD-10-CM

## 2015-06-06 DIAGNOSIS — Z801 Family history of malignant neoplasm of trachea, bronchus and lung: Secondary | ICD-10-CM | POA: Diagnosis not present

## 2015-06-06 DIAGNOSIS — F329 Major depressive disorder, single episode, unspecified: Secondary | ICD-10-CM | POA: Diagnosis present

## 2015-06-06 DIAGNOSIS — Z833 Family history of diabetes mellitus: Secondary | ICD-10-CM | POA: Diagnosis not present

## 2015-06-06 DIAGNOSIS — F419 Anxiety disorder, unspecified: Secondary | ICD-10-CM | POA: Diagnosis present

## 2015-06-06 DIAGNOSIS — F429 Obsessive-compulsive disorder, unspecified: Secondary | ICD-10-CM | POA: Diagnosis present

## 2015-06-06 DIAGNOSIS — A4151 Sepsis due to Escherichia coli [E. coli]: Principal | ICD-10-CM | POA: Diagnosis present

## 2015-06-06 DIAGNOSIS — N10 Acute tubulo-interstitial nephritis: Secondary | ICD-10-CM | POA: Diagnosis present

## 2015-06-06 DIAGNOSIS — N12 Tubulo-interstitial nephritis, not specified as acute or chronic: Secondary | ICD-10-CM

## 2015-06-06 DIAGNOSIS — Z8489 Family history of other specified conditions: Secondary | ICD-10-CM

## 2015-06-06 DIAGNOSIS — G43909 Migraine, unspecified, not intractable, without status migrainosus: Secondary | ICD-10-CM | POA: Diagnosis present

## 2015-06-06 DIAGNOSIS — Z811 Family history of alcohol abuse and dependence: Secondary | ICD-10-CM | POA: Diagnosis not present

## 2015-06-06 DIAGNOSIS — F17210 Nicotine dependence, cigarettes, uncomplicated: Secondary | ICD-10-CM | POA: Diagnosis present

## 2015-06-06 DIAGNOSIS — A419 Sepsis, unspecified organism: Secondary | ICD-10-CM

## 2015-06-06 LAB — COMPREHENSIVE METABOLIC PANEL
ALBUMIN: 4.1 g/dL (ref 3.5–5.0)
ALT: 11 U/L — ABNORMAL LOW (ref 14–54)
ANION GAP: 8 (ref 5–15)
AST: 13 U/L — ABNORMAL LOW (ref 15–41)
Alkaline Phosphatase: 71 U/L (ref 38–126)
BILIRUBIN TOTAL: 0.5 mg/dL (ref 0.3–1.2)
BUN: 12 mg/dL (ref 6–20)
CO2: 26 mmol/L (ref 22–32)
Calcium: 10.3 mg/dL (ref 8.9–10.3)
Chloride: 104 mmol/L (ref 101–111)
Creatinine, Ser: 0.75 mg/dL (ref 0.44–1.00)
Glucose, Bld: 134 mg/dL — ABNORMAL HIGH (ref 65–99)
POTASSIUM: 3.7 mmol/L (ref 3.5–5.1)
Sodium: 138 mmol/L (ref 135–145)
TOTAL PROTEIN: 7.8 g/dL (ref 6.5–8.1)

## 2015-06-06 LAB — URINALYSIS COMPLETE WITH MICROSCOPIC (ARMC ONLY)
BILIRUBIN URINE: NEGATIVE
GLUCOSE, UA: NEGATIVE mg/dL
NITRITE: POSITIVE — AB
PH: 6 (ref 5.0–8.0)
Protein, ur: 100 mg/dL — AB
Specific Gravity, Urine: 1.011 (ref 1.005–1.030)

## 2015-06-06 LAB — LACTIC ACID, PLASMA
LACTIC ACID, VENOUS: 0.7 mmol/L (ref 0.5–2.0)
LACTIC ACID, VENOUS: 1.4 mmol/L (ref 0.5–2.0)

## 2015-06-06 LAB — CBC WITH DIFFERENTIAL/PLATELET
BASOS ABS: 0.1 10*3/uL (ref 0–0.1)
EOS ABS: 0 10*3/uL (ref 0–0.7)
Eosinophils Relative: 0 %
HEMATOCRIT: 39 % (ref 35.0–47.0)
HEMOGLOBIN: 13 g/dL (ref 12.0–16.0)
Lymphocytes Relative: 7 %
Lymphs Abs: 1.2 10*3/uL (ref 1.0–3.6)
MCH: 31.5 pg (ref 26.0–34.0)
MCHC: 33.3 g/dL (ref 32.0–36.0)
MCV: 94.4 fL (ref 80.0–100.0)
Monocytes Absolute: 1.6 10*3/uL — ABNORMAL HIGH (ref 0.2–0.9)
Monocytes Relative: 9 %
NEUTROS ABS: 13.8 10*3/uL — AB (ref 1.4–6.5)
Platelets: 373 10*3/uL (ref 150–440)
RBC: 4.13 MIL/uL (ref 3.80–5.20)
RDW: 12.7 % (ref 11.5–14.5)
WBC: 16.7 10*3/uL — AB (ref 3.6–11.0)

## 2015-06-06 LAB — LIPASE, BLOOD: LIPASE: 19 U/L (ref 11–51)

## 2015-06-06 LAB — PREGNANCY, URINE: Preg Test, Ur: NEGATIVE

## 2015-06-06 MED ORDER — ENOXAPARIN SODIUM 40 MG/0.4ML ~~LOC~~ SOLN
SUBCUTANEOUS | Status: AC
  Filled 2015-06-06: qty 0.4

## 2015-06-06 MED ORDER — ONDANSETRON HCL 4 MG/2ML IJ SOLN
4.0000 mg | Freq: Once | INTRAMUSCULAR | Status: AC
Start: 2015-06-06 — End: 2015-06-06
  Administered 2015-06-06: 4 mg via INTRAVENOUS
  Filled 2015-06-06: qty 2

## 2015-06-06 MED ORDER — SODIUM CHLORIDE 0.9 % IV SOLN
1000.0000 mL | Freq: Once | INTRAVENOUS | Status: AC
  Administered 2015-06-06: 1000 mL via INTRAVENOUS

## 2015-06-06 MED ORDER — MORPHINE SULFATE (PF) 2 MG/ML IV SOLN
1.0000 mg | INTRAVENOUS | Status: DC | PRN
  Administered 2015-06-07: 1 mg via INTRAVENOUS
  Filled 2015-06-06: qty 1

## 2015-06-06 MED ORDER — MORPHINE SULFATE (PF) 4 MG/ML IV SOLN
4.0000 mg | Freq: Once | INTRAVENOUS | Status: AC
  Administered 2015-06-06: 4 mg via INTRAVENOUS
  Filled 2015-06-06: qty 1

## 2015-06-06 MED ORDER — SODIUM CHLORIDE 0.9 % IV SOLN
INTRAVENOUS | Status: DC
  Administered 2015-06-06: 23:00:00 100 mL/h via INTRAVENOUS
  Administered 2015-06-07 (×2): via INTRAVENOUS

## 2015-06-06 MED ORDER — SENNOSIDES-DOCUSATE SODIUM 8.6-50 MG PO TABS
1.0000 | ORAL_TABLET | Freq: Every evening | ORAL | Status: DC | PRN
  Administered 2015-06-07: 1 via ORAL
  Filled 2015-06-06: qty 1

## 2015-06-06 MED ORDER — HYDROCODONE-ACETAMINOPHEN 5-325 MG PO TABS
ORAL_TABLET | ORAL | Status: AC
  Filled 2015-06-06: qty 1

## 2015-06-06 MED ORDER — MORPHINE SULFATE (PF) 2 MG/ML IV SOLN
2.0000 mg | Freq: Once | INTRAVENOUS | Status: AC
  Administered 2015-06-06: 2 mg via INTRAVENOUS
  Filled 2015-06-06: qty 1

## 2015-06-06 MED ORDER — DEXTROSE 5 % IV SOLN
1.0000 g | Freq: Once | INTRAVENOUS | Status: AC
  Administered 2015-06-06: 1 g via INTRAVENOUS
  Filled 2015-06-06: qty 10

## 2015-06-06 MED ORDER — DOCUSATE SODIUM 100 MG PO CAPS
100.0000 mg | ORAL_CAPSULE | Freq: Two times a day (BID) | ORAL | Status: DC
  Administered 2015-06-06 – 2015-06-07 (×3): 100 mg via ORAL
  Filled 2015-06-06 (×2): qty 1

## 2015-06-06 MED ORDER — SODIUM CHLORIDE 0.9 % IV BOLUS (SEPSIS)
742.0000 mL | Freq: Once | INTRAVENOUS | Status: AC
  Administered 2015-06-06: 742 mL via INTRAVENOUS

## 2015-06-06 MED ORDER — DEXTROSE 5 % IV SOLN
1.0000 g | INTRAVENOUS | Status: DC
  Administered 2015-06-07: 16:00:00 1 g via INTRAVENOUS
  Filled 2015-06-06 (×2): qty 10

## 2015-06-06 MED ORDER — HYDROCODONE-ACETAMINOPHEN 5-325 MG PO TABS
1.0000 | ORAL_TABLET | ORAL | Status: DC | PRN
  Administered 2015-06-06 – 2015-06-07 (×2): 1 via ORAL
  Administered 2015-06-07 – 2015-06-08 (×2): 2 via ORAL
  Filled 2015-06-06 (×2): qty 2

## 2015-06-06 MED ORDER — DOCUSATE SODIUM 100 MG PO CAPS
ORAL_CAPSULE | ORAL | Status: AC
  Administered 2015-06-07: 100 mg via ORAL
  Filled 2015-06-06: qty 1

## 2015-06-06 MED ORDER — ACETAMINOPHEN 325 MG PO TABS
650.0000 mg | ORAL_TABLET | Freq: Four times a day (QID) | ORAL | Status: DC | PRN
  Administered 2015-06-06: 18:00:00 650 mg via ORAL

## 2015-06-06 MED ORDER — ENOXAPARIN SODIUM 40 MG/0.4ML ~~LOC~~ SOLN
40.0000 mg | SUBCUTANEOUS | Status: DC
  Administered 2015-06-06 – 2015-06-07 (×2): 40 mg via SUBCUTANEOUS
  Filled 2015-06-06: qty 0.4

## 2015-06-06 MED ORDER — ONDANSETRON HCL 4 MG/2ML IJ SOLN: 4.0000 mg | Freq: Four times a day (QID) | INTRAMUSCULAR | Status: DC | PRN

## 2015-06-06 MED ORDER — ACETAMINOPHEN 650 MG RE SUPP: 650.0000 mg | Freq: Four times a day (QID) | RECTAL | Status: DC | PRN

## 2015-06-06 MED ORDER — ONDANSETRON HCL 4 MG PO TABS: 4.0000 mg | ORAL_TABLET | Freq: Four times a day (QID) | ORAL | Status: DC | PRN

## 2015-06-06 MED ORDER — SODIUM CHLORIDE 0.9 % IV SOLN
Freq: Once | INTRAVENOUS | Status: AC
  Administered 2015-06-06: 16:00:00 via INTRAVENOUS

## 2015-06-06 MED ORDER — ACETAMINOPHEN 325 MG PO TABS
650.0000 mg | ORAL_TABLET | Freq: Once | ORAL | Status: AC
  Administered 2015-06-06: 650 mg via ORAL
  Filled 2015-06-06: qty 2

## 2015-06-06 NOTE — ED Provider Notes (Signed)
[facility] Emergency Department Provider Note  ____________________________________________    I have reviewed the triage vital signs and the nursing notes.   HISTORY  Chief Complaint Abdominal Pain and Hematuria    HPI Shelly Tyler is a 50 y.o. female who presents with 3 days of right flank pain and right upper quadrant and right lower quadrant pain which has become moderate to severe. She reports is constant. She reports she is never had this before. She reports mild nausea. She has noticed some blood in her urine. No history of kidney stones. She denies diarrhea today. No chest pain. No history of abdominal surgery. She has had fevers. No dysuria.     Past Medical History  Diagnosis Date  . Tobacco abuse   . Depression   . Anxiety   . OCD (obsessive compulsive disorder)   . Migraine     Patient Active Problem List   Diagnosis Date Noted  . Tobacco abuse 03/03/2015  . Depression 03/03/2015  . Acute anxiety 03/03/2015  . Cellulitis 02/26/2015    Past Surgical History  Procedure Laterality Date  . Cesarean section    . Bunionectomy    . Breast biopsy      Current Outpatient Rx  Name  Route  Sig  Dispense  Refill  . BLACK COHOSH PO   Oral   Take 1 capsule by mouth daily. For night sweats.         Marland Kitchen. HYDROmorphone (DILAUDID) 2 MG tablet   Oral   Take 1 tablet (2 mg total) by mouth every 4 (four) hours as needed for severe pain.   30 tablet   0   . ketoconazole (NIZORAL) 2 % cream   Topical   Apply 1 application topically daily.   15 g   0     Allergies Review of patient's allergies indicates no known allergies.  Family History  Problem Relation Age of Onset  . Diabetes type II Mother   . Cancer Mother     lung  . Diabetes Mother   . Hypertension Mother   . Osteoporosis Mother   . Alcohol abuse Father   . Cancer Paternal Grandmother     brain    Social History Social History  Substance Use Topics  . Smoking  status: Current Every Day Smoker    Types: Cigarettes  . Smokeless tobacco: Never Used  . Alcohol Use: No    Review of Systems  Constitutional: Positive for fever Eyes: Negative for redness ENT: Negative for sore throat Cardiovascular: Negative for chest pain Respiratory: Negative for shortness of breath. Gastrointestinal: As above Genitourinary: Negative for dysuria. Positive for hematuria Musculoskeletal: Mild right-sided back pain Skin: Negative for rash. Neurological: Negative for focal weakness Psychiatric: no anxiety    ____________________________________________   PHYSICAL EXAM:  VITAL SIGNS: ED Triage Vitals  Enc Vitals Group     BP 06/06/15 0958 118/77 mmHg     Pulse Rate 06/06/15 0958 96     Resp 06/06/15 0958 20     Temp 06/06/15 0958 100.2 F (37.9 C)     Temp Source 06/06/15 0958 Oral     SpO2 06/06/15 0958 99 %     Weight 06/06/15 0958 128 lb (58.06 kg)     Height 06/06/15 0958 5\' 6"  (1.676 m)     Head Cir --      Peak Flow --      Pain Score 06/06/15 0959 5     Pain Loc --  Pain Edu? --      Excl. in GC? --      Constitutional: Alert and oriented. Uncomfortable appearing Eyes: Conjunctivae are normal. No erythema or injection ENT   Head: Normocephalic and atraumatic.   Mouth/Throat: Mucous membranes are moist. Cardiovascular: Normal rate, regular rhythm. Normal and symmetric distal pulses are present in the upper extremities.  Respiratory: Normal respiratory effort without tachypnea nor retractions. Breath sounds are clear and equal bilaterally.  Gastrointestinal: Negative for tenderness to palpation in the right upper quadrant and right lower quadrant. No distention. There is right-sided CVA tenderness Genitourinary: deferred Musculoskeletal: Nontender with normal range of motion in all extremities.  Neurologic:  Normal speech and language. No gross focal neurologic deficits are appreciated. Skin:  Skin is warm, dry and intact. No  rash noted. Psychiatric: Mood and affect are normal. Patient exhibits appropriate insight and judgment.  ____________________________________________    LABS (pertinent positives/negatives)  Labs Reviewed  COMPREHENSIVE METABOLIC PANEL - Abnormal; Notable for the following:    Glucose, Bld 134 (*)    AST 13 (*)    ALT 11 (*)    All other components within normal limits  CBC WITH DIFFERENTIAL/PLATELET - Abnormal; Notable for the following:    WBC 16.7 (*)    Neutro Abs 13.8 (*)    Monocytes Absolute 1.6 (*)    All other components within normal limits  LIPASE, BLOOD  URINALYSIS COMPLETEWITH MICROSCOPIC ([facility] ONLY)  PREGNANCY, URINE    ____________________________________________   EKG  None  ____________________________________________    RADIOLOGY  CT abdomen pelvis pending  ____________________________________________   PROCEDURES  Procedure(s) performed: none  Critical Care performed: none  ____________________________________________   INITIAL IMPRESSION / ASSESSMENT AND PLAN / ED COURSE  Pertinent labs & imaging results that were available during my care of the patient were reviewed by me and considered in my medical decision making (see chart for details).  Patient presents with primarily right-sided flank pain. Differential diagnosis includes cholecystitis, ureterolithiasis, appendicitis. She reports hematuria but denies dysuria. Concerning presentation given fever. We will obtain labs, give morphine IV, Zofran IV, obtain CT renal stone study and reevaluate  ----------------------------------------- 11:47 AM on 06/06/2015 -----------------------------------------  I have asked Dr. Inocencio Homes to follow up on CT results, anticipate admission  ____________________________________________   FINAL CLINICAL IMPRESSION(S) / ED DIAGNOSES  Final diagnoses:  Flank pain, acute          Jene Every, MD 06/06/15 1147

## 2015-06-06 NOTE — ED Provider Notes (Signed)
-----------------------------------------   1:34 PM on 06/06/2015 -----------------------------------------  I assumed care of this patient from Dr. Cyril LoosenKinner at approximately 12 PM and he resulted urinalysis and ET skin. Briefly, this is a 50 year old patient presenting with right flank pain. Urine analysis is concerning for urinary tract infection. CT scan of the abdomen and pelvis shows no kidney stones but there is evidence of pyelonephritis. She has had a heart rate greater than 90 and she does have leukocytosis meeting 2 out of 4 Sirs criteria with pyelonephritis as the likely cause of sepsis. CODE sepsis was initiated. Will give liberal IV fluids, IV ceftriaxone and discuss the case with the hospitalist for admission.   Gayla DossEryka A Mistey Hoffert, MD 06/06/15 1340

## 2015-06-06 NOTE — ED Notes (Signed)
Pt sent over from [facility] for further eval of abd pain and fever.

## 2015-06-06 NOTE — ED Notes (Signed)
CODE  SEPSIS  CALLED  TO  THOMAS  AT  [facility] 

## 2015-06-06 NOTE — ED Notes (Addendum)
Pt reports pain in upper right quadrant radiating to back. Denies urinary symptoms. Pt had 102 fever yesterday and 100.2 upon arrival to ED. Pt had nausea with dry heaves yesterday, some nausea today. States that pain increases when she is lying down. Pt tolerating liquids today. NAD noted.

## 2015-06-06 NOTE — H&P (Signed)
[facility] [place] [facility]   PATIENT NAME: Shelly RoamMarjorie Durrett    MR#:  [id number]  DATE OF BIRTH:  1965/07/08  DATE OF ADMISSION:  06/06/2015  PRIMARY CARE PHYSICIAN: Vonita MossMark Crissman, MD   REQUESTING/REFERRING PHYSICIAN:Dr Inocencio HomesGayle  CHIEF COMPLAINT:  Right flank pain and nausea and chills  HISTORY OF PRESENT ILLNESS:  Shelly Tyler  is a 50 y.o. female with a known history of Migraine headaches depression and anxiety comes to the emergency room with complaints of right flank pain and nausea and chills for 1-2 days. She denies any dysuria or hematuria. Patient was found to be tachycardic with low-grade fever of 100.2 white count of 16,000 and CT of the abdomen showed right sided pyelonephritis. Patient is being admitted with sepsis secondary to acute pyelonephritis. She received a dose off IV Rocephin in the emergency room.  PAST MEDICAL HISTORY:   Past Medical History  Diagnosis Date  . Tobacco abuse   . Depression   . Anxiety   . OCD (obsessive compulsive disorder)   . Migraine     PAST SURGICAL HISTOIRY:   Past Surgical History  Procedure Laterality Date  . Cesarean section    . Bunionectomy    . Breast biopsy      SOCIAL HISTORY:   Social History  Substance Use Topics  . Smoking status: Current Every Day Smoker    Types: Cigarettes  . Smokeless tobacco: Never Used  . Alcohol Use: No    FAMILY HISTORY:   Family History  Problem Relation Age of Onset  . Diabetes type II Mother   . Cancer Mother     lung  . Diabetes Mother   . Hypertension Mother   . Osteoporosis Mother   . Alcohol abuse Father   . Cancer Paternal Grandmother     brain    DRUG ALLERGIES:  No Known Allergies  REVIEW OF SYSTEMS:  Review of Systems  Constitutional: Positive for chills. Negative for fever and weight loss.  HENT: Negative for ear discharge, ear pain and nosebleeds.   Eyes: Negative for blurred vision, pain and discharge.  Respiratory: Negative  for sputum production, shortness of breath, wheezing and stridor.   Cardiovascular: Negative for chest pain, palpitations, orthopnea and PND.  Gastrointestinal: Positive for nausea and abdominal pain. Negative for vomiting and diarrhea.  Genitourinary: Positive for flank pain. Negative for urgency and frequency.  Musculoskeletal: Negative for back pain and joint pain.  Neurological: Positive for weakness. Negative for sensory change, speech change and focal weakness.  Psychiatric/Behavioral: Negative for depression and hallucinations. The patient is not nervous/anxious.   All other systems reviewed and are negative.    MEDICATIONS AT HOME:   Prior to Admission medications   Medication Sig Start Date End Date Taking? Authorizing Provider  BLACK COHOSH PO Take 1 capsule by mouth daily. For night sweats.   Yes Historical Provider, MD  HYDROmorphone (DILAUDID) 2 MG tablet Take 1 tablet (2 mg total) by mouth every 4 (four) hours as needed for severe pain. 02/28/15   Enedina FinnerSona Aking Klabunde, MD  ketoconazole (NIZORAL) 2 % cream Apply 1 application topically daily. 04/06/15   Megan P Johnson, DO      VITAL SIGNS:  Blood pressure 111/58, pulse 90, temperature 100.2 F (37.9 C), temperature source Oral, resp. rate 18, height 5\' 6"  (1.676 m), weight 58.06 kg (128 lb), SpO2 97 %.  PHYSICAL EXAMINATION:  GENERAL:  50 y.o.-year-old patient lying in the bed with no acute distress.  EYES: Pupils equal, round, reactive to light and accommodation. No scleral icterus. Extraocular muscles intact.  HEENT: Head atraumatic, normocephalic. Oropharynx and nasopharynx clear.  NECK:  Supple, no jugular venous distention. No thyroid enlargement, no tenderness.  LUNGS: Normal breath sounds bilaterally, no wheezing, rales,rhonchi or crepitation. No use of accessory muscles of respiration.  CARDIOVASCULAR: S1, S2 normal. No murmurs, rubs, or gallops.  ABDOMEN: Soft, nontender, nondistended. Bowel sounds present. No organomegaly  or mass.  EXTREMITIES: No pedal edema, cyanosis, or clubbing.  NEUROLOGIC: Cranial nerves II through XII are intact. Muscle strength 5/5 in all extremities. Sensation intact. Gait not checked.  PSYCHIATRIC: The patient is alert and oriented x 3.  SKIN: No obvious rash, lesion, or ulcer.   LABORATORY PANEL:   CBC  Recent Labs Lab 06/06/15 1005  WBC 16.7*  HGB 13.0  HCT 39.0  PLT 373   ------------------------------------------------------------------------------------------------------------------  Chemistries   Recent Labs Lab 06/06/15 1005  NA 138  K 3.7  CL 104  CO2 26  GLUCOSE 134*  BUN 12  CREATININE 0.75  CALCIUM 10.3  AST 13*  ALT 11*  ALKPHOS 71  BILITOT 0.5   ------------------------------------------------------------------------------------------------------------------  Cardiac Enzymes No results for input(s): TROPONINI in the last 168 hours. ------------------------------------------------------------------------------------------------------------------  RADIOLOGY:  Ct Renal Stone Study  06/06/2015  CLINICAL DATA:  50 year old presenting with acute onset of progressively worsening right flank pain, right upper quadrant abdominal pain and right lower quadrant abdominal pain associated with nausea and pneumaturia. EXAM: CT ABDOMEN AND PELVIS WITHOUT CONTRAST TECHNIQUE: Multidetector CT imaging of the abdomen and pelvis was performed following the standard protocol without IV contrast. COMPARISON:  11/15/2005. FINDINGS: Lower chest: Mild dependent atelectasis in the lower lobes, as expected. Visualized lung bases otherwise clear. Heart size normal. Hepatobiliary: Borderline to mild hepatomegaly with no focal hepatic parenchymal abnormality, allowing for the unenhanced technique. Anatomic variant in the left lobe extends well across the midline into the left upper quadrant. Gallbladder normal in appearance without calcified gallstones. No biliary ductal  dilation. Pancreas: Normal unenhanced appearance. Spleen: Normal unenhanced appearance. Adrenals/Urinary Tract: Normal appearing adrenal glands. Perinephric edema surrounding right kidney with thickening of the wall of the right renal pelvis and the proximal right ureter. No evidence of urinary tract calculi or hydronephrosis on either side. Within limits of the unenhanced technique, no focal parenchymal abnormality involving either kidney. Normal-appearing urinary bladder. Stomach/Bowel: Stomach normal in appearance for the degree of distention. Normal-appearing small bowel. Normal-appearing colon with expected stool burden. Cecum extends low in the right side of the pelvis in what I believe is a normal-appearing short appendix is identified in the right mid pelvis. Vascular/Lymphatic: Mild atherosclerosis involving the abdominal aorta without evidence of aneurysm. No pathologic lymphadenopathy. Reproductive: Normal-appearing uterus and ovaries without evidence of adnexal mass. Other: None. Musculoskeletal: Schmorl's nodes involving every vertebral body from T9 through L1. No acute abnormality. IMPRESSION: 1. Borderline to mild hepatomegaly without focal hepatic parenchymal abnormality. 2. Perinephric edema at surrounding the right kidney with thickening of the wall of the right renal pelvis and proximal right ureter. Query pyelonephritis/urinary tract infection. 3. No evidence of urinary tract calculi or hydronephrosis on either side. Electronically Signed   By: Hulan Saas M.D.   On: 06/06/2015 13:29   IMPRESSION AND PLAN:   Asli Tokarski  is a 50 y.o. female with a known history of Migraine headaches depression and anxiety comes to the emergency room with complaints of right flank pain and nausea and chills for 1-2 days. She denies  any dysuria or hematuria. Patient was found to be tachycardic with low-grade fever of 100.2 white count of 16,000 and CT of the abdomen showed right sided  pyelonephritis.  1. Sepsis secondary to acute pyelonephritis -Admit to medical floor -IV fluids for hydration -Continue IV Rocephin. Follow blood culture urine culture. Narrow down antibiotics according to urine cultures  2. Leukocytosis due to 1  3. DVT prophylaxis subcutaneous Lovenox  All the records are reviewed and case discussed with ED provider. Management plans discussed with the patient, family and they are in agreement.  CODE STATUS: full  TOTAL TIME TAKING CARE OF THIS PATIENT: 50 minutes.    Ludy Messamore M.D on 06/06/2015 at 2:00 PM  Between 7am to 6pm - Pager - [phone]  After 6pm go to [email or web address] - password [email or web address] [facility]  [place]  [facility]ospitalists  Office  [phone]  CC: Primary care physician; Vonita Moss, MD

## 2015-06-07 LAB — CBC
HCT: 33.9 % — ABNORMAL LOW (ref 35.0–47.0)
HEMOGLOBIN: 11.3 g/dL — AB (ref 12.0–16.0)
MCH: 32.4 pg (ref 26.0–34.0)
MCHC: 33.2 g/dL (ref 32.0–36.0)
MCV: 97.5 fL (ref 80.0–100.0)
PLATELETS: 292 10*3/uL (ref 150–440)
RBC: 3.47 MIL/uL — AB (ref 3.80–5.20)
RDW: 12.8 % (ref 11.5–14.5)
WBC: 16.5 10*3/uL — AB (ref 3.6–11.0)

## 2015-06-07 MED ORDER — HYDROCODONE-ACETAMINOPHEN 5-325 MG PO TABS
ORAL_TABLET | ORAL | Status: AC
  Filled 2015-06-07: qty 2

## 2015-06-07 NOTE — Progress Notes (Signed)
Patient ID: Shelly Tyler, female   DOB: [birth year], 50 y.o.   MRN: [id number] [facility] [place] [facility]   PATIENT NAME: Shelly RoamMarjorie Tyler    MR#:  [id number]  DATE OF BIRTH:  [birth year]  SUBJECTIVE:  Some right side flank pain. No fever today  REVIEW OF SYSTEMS:   Review of Systems  Constitutional: Negative for fever, chills and weight loss.  HENT: Negative for ear discharge, ear pain and nosebleeds.   Eyes: Negative for blurred vision, pain and discharge.  Respiratory: Negative for sputum production, shortness of breath, wheezing and stridor.   Cardiovascular: Negative for chest pain, palpitations, orthopnea and PND.  Gastrointestinal: Negative for nausea, vomiting, abdominal pain and diarrhea.  Genitourinary: Positive for flank pain. Negative for urgency and frequency.  Musculoskeletal: Negative for back pain and joint pain.  Neurological: Positive for weakness. Negative for sensory change, speech change and focal weakness.  Psychiatric/Behavioral: Negative for depression and hallucinations. The patient is not nervous/anxious.   All other systems reviewed and are negative.  Tolerating Diet:yes Tolerating PT: not needed  DRUG ALLERGIES:  No Known Allergies  VITALS:  Blood pressure 100/58, pulse 78, temperature 98.6 F (37 C), temperature source Oral, resp. rate 16, height 5\' 6"  (1.676 m), weight 58.06 kg (128 lb), SpO2 94 %.  PHYSICAL EXAMINATION:   Physical Exam  GENERAL:  50 y.o.-year-old patient lying in the bed with no acute distress.  EYES: Pupils equal, round, reactive to light and accommodation. No scleral icterus. Extraocular muscles intact.  HEENT: Head atraumatic, normocephalic. Oropharynx and nasopharynx clear.  NECK:  Supple, no jugular venous distention. No thyroid enlargement, no tenderness.  LUNGS: Normal breath sounds bilaterally, no wheezing, rales, rhonchi. No use of accessory muscles of respiration.  CARDIOVASCULAR: S1,  S2 normal. No murmurs, rubs, or gallops.  ABDOMEN: Soft, nontender, nondistended. Bowel sounds present. No organomegaly or mass.  EXTREMITIES: No cyanosis, clubbing or edema b/l.    NEUROLOGIC: Cranial nerves II through XII are intact. No focal Motor or sensory deficits b/l.   PSYCHIATRIC:  patient is alert and oriented x 3.  SKIN: No obvious rash, lesion, or ulcer.   LABORATORY PANEL:  CBC  Recent Labs Lab 06/07/15 0341  WBC 16.5*  HGB 11.3*  HCT 33.9*  PLT 292    Chemistries   Recent Labs Lab 06/06/15 1005  NA 138  K 3.7  CL 104  CO2 26  GLUCOSE 134*  BUN 12  CREATININE 0.75  CALCIUM 10.3  AST 13*  ALT 11*  ALKPHOS 71  BILITOT 0.5   Cardiac Enzymes No results for input(s): TROPONINI in the last 168 hours. RADIOLOGY:  Ct Renal Stone Study  06/06/2015  CLINICAL DATA:  50 year old presenting with acute onset of progressively worsening right flank pain, right upper quadrant abdominal pain and right lower quadrant abdominal pain associated with nausea and pneumaturia. EXAM: CT ABDOMEN AND PELVIS WITHOUT CONTRAST TECHNIQUE: Multidetector CT imaging of the abdomen and pelvis was performed following the standard protocol without IV contrast. COMPARISON:  11/15/2005. FINDINGS: Lower chest: Mild dependent atelectasis in the lower lobes, as expected. Visualized lung bases otherwise clear. Heart size normal. Hepatobiliary: Borderline to mild hepatomegaly with no focal hepatic parenchymal abnormality, allowing for the unenhanced technique. Anatomic variant in the left lobe extends well across the midline into the left upper quadrant. Gallbladder normal in appearance without calcified gallstones. No biliary ductal dilation. Pancreas: Normal unenhanced appearance. Spleen: Normal unenhanced appearance. Adrenals/Urinary Tract: Normal appearing adrenal glands. Perinephric  edema surrounding right kidney with thickening of the wall of the right renal pelvis and the proximal right ureter. No  evidence of urinary tract calculi or hydronephrosis on either side. Within limits of the unenhanced technique, no focal parenchymal abnormality involving either kidney. Normal-appearing urinary bladder. Stomach/Bowel: Stomach normal in appearance for the degree of distention. Normal-appearing small bowel. Normal-appearing colon with expected stool burden. Cecum extends low in the right side of the pelvis in what I believe is a normal-appearing short appendix is identified in the right mid pelvis. Vascular/Lymphatic: Mild atherosclerosis involving the abdominal aorta without evidence of aneurysm. No pathologic lymphadenopathy. Reproductive: Normal-appearing uterus and ovaries without evidence of adnexal mass. Other: None. Musculoskeletal: Schmorl's nodes involving every vertebral body from T9 through L1. No acute abnormality. IMPRESSION: 1. Borderline to mild hepatomegaly without focal hepatic parenchymal abnormality. 2. Perinephric edema at surrounding the right kidney with thickening of the wall of the right renal pelvis and proximal right ureter. Query pyelonephritis/urinary tract infection. 3. No evidence of urinary tract calculi or hydronephrosis on either side. Electronically Signed   By: Hulan Saas M.D.   On: 06/06/2015 13:29   ASSESSMENT AND PLAN:  Shelly Tyler is a 50 y.o. female with a known history of Migraine headaches depression and anxiety comes to the emergency room with complaints of right flank pain and nausea and chills for 1-2 days. She denies any dysuria or hematuria. Patient was found to be tachycardic with low-grade fever of 100.2 white count of 16,000 and CT of the abdomen showed right sided pyelonephritis.  1. Sepsis secondary to acute pyelonephritis -IV fluids for hydration -Continue IV Rocephin.  - blood culture negative so far -urine culture was not sent by ER. Will sent one today  Narrow down antibiotics according to urine cultures  2. Leukocytosis due to 1  3. DVT  prophylaxis subcutaneous Lovenox  Case discussed with Care Management/Social Worker. Management plans discussed with the patient, family and they are in agreement.  CODE STATUS: full  DVT Prophylaxis: lovenox  TOTAL TIME TAKING CARE OF THIS PATIENT: 30 minutes.  >50% time spent on counselling and coordination of care  POSSIBLE D/C IN 1 DAYS, DEPENDING ON CLINICAL CONDITION.  Note: This dictation was prepared with Dragon dictation along with smaller phrase technology. Any transcriptional errors that result from this process are unintentional.  Danyela Posas M.D on 06/07/2015 at 11:59 AM  Between 7am to 6pm - Pager - [phone]  After 6pm go to [email or web address] - password [email or web address] [facility]  [place] [facility]  Office  [phone]  CC: Primary care physician; Vonita Moss, MD

## 2015-06-08 LAB — CBC
HEMATOCRIT: 30.5 % — AB (ref 35.0–47.0)
HEMOGLOBIN: 10.3 g/dL — AB (ref 12.0–16.0)
MCH: 32 pg (ref 26.0–34.0)
MCHC: 33.8 g/dL (ref 32.0–36.0)
MCV: 94.6 fL (ref 80.0–100.0)
Platelets: 293 10*3/uL (ref 150–440)
RBC: 3.23 MIL/uL — AB (ref 3.80–5.20)
RDW: 12.6 % (ref 11.5–14.5)
WBC: 13.3 10*3/uL — AB (ref 3.6–11.0)

## 2015-06-08 MED ORDER — CEPHALEXIN 500 MG PO CAPS: 500.0000 mg | ORAL_CAPSULE | Freq: Two times a day (BID) | ORAL | Status: DC

## 2015-06-08 NOTE — Progress Notes (Signed)
Patient ID: Shelly Tyler, female   DOB: [birth year], 50 y.o.   MRN: [id number]                                          [facility]    Shelly RoamMarjorie Tyler was admitted to the Hospital on 06/06/2015 and Discharged  06/08/2015 and should be excused from work/school   for 5  days starting 06/06/2015 , may return to work/school without any restrictions.  Call Shelly FinnerSona Travante Knee MD, [facility]  [phone] with questions.  Bonny Vanleeuwen M.D on 06/08/2015,at 9:46 AM

## 2015-06-08 NOTE — Progress Notes (Signed)
Discussed discharge instruction and medications with pt.  IVs removed.  No questions at this time.  Pt transported home via car by family.  Danielle Shonda Mandarino, RN 

## 2015-06-08 NOTE — Discharge Summary (Signed)
[facility] - [place] at [facility]   PATIENT NAME: Shelly Tyler    MR#:  [id number]  DATE OF BIRTH:  1965/11/20  DATE OF ADMISSION:  06/06/2015 ADMITTING PHYSICIAN: Enedina Finner, MD  DATE OF DISCHARGE: 06/08/15  PRIMARY CARE PHYSICIAN: Vonita Moss, MD    ADMISSION DIAGNOSIS:  Pyelonephritis [N12] Flank pain, acute [R10.10] Sepsis, due to unspecified organism (HCC) [A41.9]  DISCHARGE DIAGNOSIS:  Sepsis Acute Pyelonephritis E coli UTI SECONDARY DIAGNOSIS:   Past Medical History  Diagnosis Date  . Tobacco abuse   . Depression   . Anxiety   . OCD (obsessive compulsive disorder)   . Migraine     HOSPITAL COURSE:  Shelly Tyler is a 50 y.o. female with a known history of Migraine headaches depression and anxiety comes to the emergency room with complaints of right flank pain and nausea and chills for 1-2 days. She denies any dysuria or hematuria. Patient was found to be tachycardic with low-grade fever of 100.2 white count of 16,000 and CT of the abdomen showed right sided pyelonephritis.  1. Sepsis secondary to acute pyelonephritis -received IV fluids for hydration -received IV Rocephin---change to po keflex - blood culture negative so far -urine culture positive for ecoli  2. Leukocytosis due to 1 Trending down from 16K--13K  3. DVT prophylaxis subcutaneous Lovenox  -overall improving.   CONSULTS OBTAINED:     DRUG ALLERGIES:  No Known Allergies  DISCHARGE MEDICATIONS:   Current Discharge Medication List    START taking these medications   Details  cephALEXin (KEFLEX) 500 MG capsule Take 1 capsule (500 mg total) by mouth 2 (two) times daily. Qty: 16 capsule, Refills: 0      CONTINUE these medications which have NOT CHANGED   Details  BLACK COHOSH PO Take 1 capsule by mouth daily. For night sweats.    HYDROmorphone (DILAUDID) 2 MG tablet Take 1 tablet (2 mg total) by mouth every 4 (four) hours as needed for severe pain. Qty:  30 tablet, Refills: 0      STOP taking these medications     ketoconazole (NIZORAL) 2 % cream         If you experience worsening of your admission symptoms, develop shortness of breath, life threatening emergency, suicidal or homicidal thoughts you must seek medical attention immediately by calling 911 or calling your MD immediately  if symptoms less severe.  You Must read complete instructions/literature along with all the possible adverse reactions/side effects for all the Medicines you take and that have been prescribed to you. Take any new Medicines after you have completely understood and accept all the possible adverse reactions/side effects.   Please note  You were cared for by a hospitalist during your hospital stay. If you have any questions about your discharge medications or the care you received while you were in the hospital after you are discharged, you can call the unit and asked to speak with the hospitalist on call if the hospitalist that took care of you is not available. Once you are discharged, your primary care physician will handle any further medical issues. Please note that NO REFILLS for any discharge medications will be authorized once you are discharged, as it is imperative that you return to your primary care physician (or establish a relationship with a primary care physician if you do not have one) for your aftercare needs so that they can reassess your need for medications and monitor your lab values. Today   SUBJECTIVE  Doing well. Some mild right flank pain  VITAL SIGNS:  Blood pressure 110/70, pulse 78, temperature 99 F (37.2 C), temperature source Oral, resp. rate 18, height  (1.676 m), weight 58.06 kg (128 lb), SpO2 94 %.  I/O:   Intake/Output Summary (Last 24 hours) at 06/08/15 0848 Last data filed at 06/07/15 0900  Gross per 24 hour  Intake    240 ml  Output      0 ml  Net    240 ml    PHYSICAL EXAMINATION:  GENERAL:  50 y.o.-year-old  patient lying in the bed with no acute distress.  EYES: Pupils equal, round, reactive to light and accommodation. No scleral icterus. Extraocular muscles intact.  HEENT: Head atraumatic, normocephalic. Oropharynx and nasopharynx clear.  NECK:  Supple, no jugular venous distention. No thyroid enlargement, no tenderness.  LUNGS: Normal breath sounds bilaterally, no wheezing, rales,rhonchi or crepitation. No use of accessory muscles of respiration.  CARDIOVASCULAR: S1, S2 normal. No murmurs, rubs, or gallops.  ABDOMEN: Soft, non-tender, non-distended. Bowel sounds present. No organomegaly or mass.  EXTREMITIES: No pedal edema, cyanosis, or clubbing.  NEUROLOGIC: Cranial nerves II through XII are intact. Muscle strength 5/5 in all extremities. Sensation intact. Gait not checked.  PSYCHIATRIC:  patient is alert and oriented x 3.  SKIN: No obvious rash, lesion, or ulcer.   DATA REVIEW:   CBC   Recent Labs Lab 06/08/15 0516  WBC 13.3*  HGB 10.3*  HCT 30.5*  PLT 293    Chemistries   Recent Labs Lab 06/06/15 1005  NA 138  K 3.7  CL 104  CO2 26  GLUCOSE 134*  BUN 12  CREATININE 0.75  CALCIUM 10.3  AST 13*  ALT 11*  ALKPHOS 71  BILITOT 0.5    Microbiology Results   Recent Results (from the past 240 hour(s))  Blood culture (routine x 2)     Status: None (Preliminary result)   Collection Time: 06/06/15  1:25 PM  Result Value Ref Range Status   Specimen Description BLOOD LEFT ASSIST CONTROL  Final   Special Requests BOTTLES DRAWN AEROBIC AND ANAEROBIC  5CC  Final   Culture NO GROWTH < 24 HOURS  Final   Report Status PENDING  Incomplete  Blood culture (routine x 2)     Status: None (Preliminary result)   Collection Time: 06/06/15  1:33 PM  Result Value Ref Range Status   Specimen Description BLOOD RIGHT ASSIST CONTROL  Final   Special Requests BOTTLES DRAWN AEROBIC AND ANAEROBIC  6CC  Final   Culture NO GROWTH < 24 HOURS  Final   Report Status PENDING  Incomplete     RADIOLOGY:  Ct Renal Stone Study  06/06/2015  CLINICAL DATA:  50 year old presenting with acute onset of progressively worsening right flank pain, right upper quadrant abdominal pain and right lower quadrant abdominal pain associated with nausea and pneumaturia. EXAM: CT ABDOMEN AND PELVIS WITHOUT CONTRAST TECHNIQUE: Multidetector CT imaging of the abdomen and pelvis was performed following the standard protocol without IV contrast. COMPARISON:  11/15/2005. FINDINGS: Lower chest: Mild dependent atelectasis in the lower lobes, as expected. Visualized lung bases otherwise clear. Heart size normal. Hepatobiliary: Borderline to mild hepatomegaly with no focal hepatic parenchymal abnormality, allowing for the unenhanced technique. Anatomic variant in the left lobe extends well across the midline into the left upper quadrant. Gallbladder normal in appearance without calcified gallstones. No biliary ductal dilation. Pancreas: Normal unenhanced appearance. Spleen: Normal unenhanced appearance. Adrenals/Urinary Tract: Normal appearing adrenal  glands. Perinephric edema surrounding right kidney with thickening of the wall of the right renal pelvis and the proximal right ureter. No evidence of urinary tract calculi or hydronephrosis on either side. Within limits of the unenhanced technique, no focal parenchymal abnormality involving either kidney. Normal-appearing urinary bladder. Stomach/Bowel: Stomach normal in appearance for the degree of distention. Normal-appearing small bowel. Normal-appearing colon with expected stool burden. Cecum extends low in the right side of the pelvis in what I believe is a normal-appearing short appendix is identified in the right mid pelvis. Vascular/Lymphatic: Mild atherosclerosis involving the abdominal aorta without evidence of aneurysm. No pathologic lymphadenopathy. Reproductive: Normal-appearing uterus and ovaries without evidence of adnexal mass. Other: None. Musculoskeletal:  Schmorl's nodes involving every vertebral body from T9 through L1. No acute abnormality. IMPRESSION: 1. Borderline to mild hepatomegaly without focal hepatic parenchymal abnormality. 2. Perinephric edema at surrounding the right kidney with thickening of the wall of the right renal pelvis and proximal right ureter. Query pyelonephritis/urinary tract infection. 3. No evidence of urinary tract calculi or hydronephrosis on either side. Electronically Signed   By: Hulan Saashomas  Lawrence M.D.   On: 06/06/2015 13:29     Management plans discussed with the patient, family and they are in agreement.  CODE STATUS:     Code Status Orders        Start     Ordered   06/06/15 1658  Full code   Continuous     06/06/15 1657    Code Status History    Date Active Date Inactive Code Status Order ID Comments User Context   02/26/2015  2:17 PM 02/28/2015  3:27 PM Full Code [id number]  Enedina FinnerSona Chrysten Woulfe, MD ED      TOTAL TIME TAKING CARE OF THIS PATIENT: 40 minutes.    Jaylun Fleener M.D on 06/08/2015 at 8:48 AM  Between 7am to 6pm - Pager - [phone] After 6pm go to [email or web address] - password [email or web address] [facility]  [facility]  Office  [phone]  CC: Primary care physician; Vonita MossMark Crissman, MD

## 2015-06-09 LAB — URINE CULTURE: Culture: >=100000 — AB

## 2015-06-11 LAB — CULTURE, BLOOD (ROUTINE X 2)
Culture: NO GROWTH
Culture: NO GROWTH

## 2015-06-16 ENCOUNTER — Ambulatory Visit (INDEPENDENT_AMBULATORY_CARE_PROVIDER_SITE_OTHER): Payer: BLUE CROSS/BLUE SHIELD | Admitting: Family Medicine

## 2015-06-16 ENCOUNTER — Encounter: Payer: Self-pay | Admitting: Family Medicine

## 2015-06-16 VITALS — BP 115/71 | HR 72 | Temp 98.2°F | Wt 132.0 lb

## 2015-06-16 DIAGNOSIS — N10 Acute tubulo-interstitial nephritis: Secondary | ICD-10-CM | POA: Diagnosis not present

## 2015-06-16 DIAGNOSIS — Z113 Encounter for screening for infections with a predominantly sexual mode of transmission: Secondary | ICD-10-CM

## 2015-06-16 DIAGNOSIS — N912 Amenorrhea, unspecified: Secondary | ICD-10-CM

## 2015-06-16 LAB — UA/M W/RFLX CULTURE, ROUTINE
Bilirubin, UA: NEGATIVE
GLUCOSE, UA: NEGATIVE
KETONES UA: NEGATIVE
LEUKOCYTES UA: NEGATIVE
Nitrite, UA: NEGATIVE
Protein, UA: NEGATIVE
RBC, UA: NEGATIVE
Specific Gravity, UA: <1.005 — ABNORMAL LOW (ref 1.005–1.030)
Urobilinogen, Ur: 0.2 mg/dL (ref 0.2–1.0)
pH, UA: 6 (ref 5.0–7.5)

## 2015-06-16 NOTE — Progress Notes (Signed)
BP 115/71 mmHg  Pulse 72  Temp(Src) 98.2 F (36.8 C)  Wt 132 lb (59.875 kg)  SpO2 100%   Subjective:    Patient ID: Shelly Tyler, female    DOB: 05/13/1965, 50 y.o.   MRN: [id number]  HPI: Shelly Tyler is a 50 y.o. female  Chief Complaint  Patient presents with  . Hospitalization Follow-up    Patient was admiited due to a kidney and bladder infection  . Menopause    Patient would like to have some hormone labs checked   ER FOLLOW UP Time since discharge: 8 days Hospital/facility: [facility] Diagnosis: Pyelonephritis/Sepsis- e coli, pan-sensative Procedures/tests: CT scan Consultants: None New medications: Rocephin IV, keflex Discharge instructions:  Follow up here Status: better  MENOPAUSAL SYMPTOMS Gravida/Para: Duration: 2 years ago Symptom severity: mild Hot flashes: no Night sweats: yes Sleep disturbances: no Vaginal dryness: yes Dyspareunia:yes Decreased libido: yes Emotional lability: yes Stress incontinence: no Previous HRT/pharmacotherapy: no Hysterectomy: no Absolute Contraindications to Hormonal Therapy:     Undiagnosed vaginal bleeding: no    Breast cancer: no, Aunts    Endometrial cancer: no    Coronary disease: no    Cerebrovascular disease: no    Venous thromboembolic disease: no  Relevant past medical, surgical, family and social history reviewed and updated as indicated. Interim medical history since our last visit reviewed. Allergies and medications reviewed and updated.  Review of Systems  Constitutional: Negative.   Respiratory: Negative.   Cardiovascular: Negative.   Psychiatric/Behavioral: Negative.     Per HPI unless specifically indicated above     Objective:    BP 115/71 mmHg  Pulse 72  Temp(Src) 98.2 F (36.8 C)  Wt 132 lb (59.875 kg)  SpO2 100%  Wt Readings from Last 3 Encounters:  06/16/15 132 lb (59.875 kg)  06/06/15 128 lb (58.06 kg)  04/06/15 135 lb (61.236 kg)    Physical Exam  Constitutional: She is oriented  to person, place, and time. She appears well-developed and well-nourished. No distress.  HENT:  Head: Normocephalic and atraumatic.  Right Ear: Hearing normal.  Left Ear: Hearing normal.  Nose: Nose normal.  Eyes: Conjunctivae and lids are normal. Right eye exhibits no discharge. Left eye exhibits no discharge. No scleral icterus.  Cardiovascular: Normal rate, regular rhythm, normal heart sounds and intact distal pulses.  Exam reveals no gallop and no friction rub.   No murmur heard. Pulmonary/Chest: Effort normal and breath sounds normal. No respiratory distress. She has no wheezes. She has no rales. She exhibits no tenderness.  Musculoskeletal: Normal range of motion.  Neurological: She is alert and oriented to person, place, and time.  Skin: Skin is warm, dry and intact. No rash noted. No erythema. No pallor.  Psychiatric: She has a normal mood and affect. Her speech is normal and behavior is normal. Judgment and thought content normal. Cognition and memory are normal.  Nursing note and vitals reviewed.   Results for orders placed or performed during the hospital encounter of 06/06/15  Blood culture (routine x 2)  Result Value Ref Range   Specimen Description BLOOD LEFT ASSIST CONTROL    Special Requests BOTTLES DRAWN AEROBIC AND ANAEROBIC  5CC    Culture NO GROWTH 5 DAYS    Report Status 06/11/2015 FINAL   Blood culture (routine x 2)  Result Value Ref Range   Specimen Description BLOOD RIGHT ASSIST CONTROL    Special Requests BOTTLES DRAWN AEROBIC AND ANAEROBIC  6CC    Culture NO GROWTH 5  DAYS    Report Status 06/11/2015 FINAL   Urine culture  Result Value Ref Range   Specimen Description URINE, CLEAN CATCH    Special Requests NONE    Culture >=100,000 COLONIES/mL ESCHERICHIA COLI (A)    Report Status 06/09/2015 FINAL    Organism ID, Bacteria ESCHERICHIA COLI (A)       Susceptibility   Escherichia coli - MIC*    AMPICILLIN <=2 SENSITIVE Sensitive     CEFAZOLIN <=4  SENSITIVE Sensitive     CEFTRIAXONE <=1 SENSITIVE Sensitive     CIPROFLOXACIN <=0.25 SENSITIVE Sensitive     GENTAMICIN <=1 SENSITIVE Sensitive     IMIPENEM <=0.25 SENSITIVE Sensitive     NITROFURANTOIN <=16 SENSITIVE Sensitive     TRIMETH/SULFA <=20 SENSITIVE Sensitive     AMPICILLIN/SULBACTAM <=2 SENSITIVE Sensitive     PIP/TAZO <=4 SENSITIVE Sensitive     Extended ESBL NEGATIVE Sensitive     * >=100,000 COLONIES/mL ESCHERICHIA COLI  Comprehensive metabolic panel  Result Value Ref Range   Sodium 138 135 - 145 mmol/L   Potassium 3.7 3.5 - 5.1 mmol/L   Chloride 104 101 - 111 mmol/L   CO2 26 22 - 32 mmol/L   Glucose, Bld 134 (H) 65 - 99 mg/dL   BUN 12 6 - 20 mg/dL   Creatinine, Ser 1.61 0.44 - 1.00 mg/dL   Calcium 09.6 8.9 - 04.5 mg/dL   Total Protein 7.8 6.5 - 8.1 g/dL   Albumin 4.1 3.5 - 5.0 g/dL   AST 13 (L) 15 - 41 U/L   ALT 11 (L) 14 - 54 U/L   Alkaline Phosphatase 71 38 - 126 U/L   Total Bilirubin 0.5 0.3 - 1.2 mg/dL   GFR calc non Af Amer >60 >60 mL/min   GFR calc Af Amer >60 >60 mL/min   Anion gap 8 5 - 15  Lipase, blood  Result Value Ref Range   Lipase 19 11 - 51 U/L  CBC with Differential  Result Value Ref Range   WBC 16.7 (H) 3.6 - 11.0 K/uL   RBC 4.13 3.80 - 5.20 MIL/uL   Hemoglobin 13.0 12.0 - 16.0 g/dL   HCT 40.9 81.1 - 91.4 %   MCV 94.4 80.0 - 100.0 fL   MCH 31.5 26.0 - 34.0 pg   MCHC 33.3 32.0 - 36.0 g/dL   RDW 78.2 95.6 - 21.3 %   Platelets 373 150 - 440 K/uL   Neutrophils Relative % 84% %   Neutro Abs 13.8 (H) 1.4 - 6.5 K/uL   Lymphocytes Relative 7% %   Lymphs Abs 1.2 1.0 - 3.6 K/uL   Monocytes Relative 9% %   Monocytes Absolute 1.6 (H) 0.2 - 0.9 K/uL   Eosinophils Relative 0% %   Eosinophils Absolute 0.0 0 - 0.7 K/uL   Basophils Relative 0% %   Basophils Absolute 0.1 0 - 0.1 K/uL  Urinalysis complete, with microscopic  Result Value Ref Range   Color, Urine YELLOW (A) YELLOW   APPearance CLOUDY (A) CLEAR   Glucose, UA NEGATIVE NEGATIVE mg/dL    Bilirubin Urine NEGATIVE NEGATIVE   Ketones, ur TRACE (A) NEGATIVE mg/dL   Specific Gravity, Urine 1.011 1.005 - 1.030   Hgb urine dipstick 3+ (A) NEGATIVE   pH 6.0 5.0 - 8.0   Protein, ur 100 (A) NEGATIVE mg/dL   Nitrite POSITIVE (A) NEGATIVE   Leukocytes, UA 2+ (A) NEGATIVE   RBC / HPF 6-30 0 - 5 RBC/hpf   WBC,  UA TOO NUMEROUS TO COUNT 0 - 5 WBC/hpf   Bacteria, UA MANY (A) NONE SEEN   Squamous Epithelial / LPF 0-5 (A) NONE SEEN   WBC Clumps PRESENT    Mucous PRESENT    Hyaline Casts, UA PRESENT   Pregnancy, urine  Result Value Ref Range   Preg Test, Ur NEGATIVE NEGATIVE  Lactic acid, plasma  Result Value Ref Range   Lactic Acid, Venous 0.7 0.5 - 2.0 mmol/L  Lactic acid, plasma  Result Value Ref Range   Lactic Acid, Venous 1.4 0.5 - 2.0 mmol/L  CBC  Result Value Ref Range   WBC 16.5 (H) 3.6 - 11.0 K/uL   RBC 3.47 (L) 3.80 - 5.20 MIL/uL   Hemoglobin 11.3 (L) 12.0 - 16.0 g/dL   HCT 13.033.9 (L) 86.535.0 - 78.447.0 %   MCV 97.5 80.0 - 100.0 fL   MCH 32.4 26.0 - 34.0 pg   MCHC 33.2 32.0 - 36.0 g/dL   RDW 69.612.8 29.511.5 - 28.414.5 %   Platelets 292 150 - 440 K/uL  CBC  Result Value Ref Range   WBC 13.3 (H) 3.6 - 11.0 K/uL   RBC 3.23 (L) 3.80 - 5.20 MIL/uL   Hemoglobin 10.3 (L) 12.0 - 16.0 g/dL   HCT 13.230.5 (L) 44.035.0 - 10.247.0 %   MCV 94.6 80.0 - 100.0 fL   MCH 32.0 26.0 - 34.0 pg   MCHC 33.8 32.0 - 36.0 g/dL   RDW 72.512.6 36.611.5 - 44.014.5 %   Platelets 293 150 - 440 K/uL      Assessment & Plan:   Problem List Items Addressed This Visit      Genitourinary   Acute pyelonephritis - Primary    Resolved. UA clean today. Warning signs discussed. Call with any concerns.       Relevant Orders   UA/M w/rflx Culture, Routine    Other Visit Diagnoses    Screening for STD (sexually transmitted disease)        Screening labs checked today. Await results.     Relevant Orders    Hepatitis C Antibody    HIV antibody    Amenorrhea        Likely post-menopausal. Will check labs to confirm and look for  other causes.     Relevant Orders    FSH    LH    DHEA-sulfate    Estradiol    Testosterone, free, total    Prolactin    Thyroid Panel With TSH        Follow up plan: Return 3-6 months, for Physical.

## 2015-06-16 NOTE — Patient Instructions (Signed)
Asymptomatic Bacteriuria, Female Asymptomatic bacteriuria is the presence of a large number of bacteria in your urine without the usual symptoms of burning or frequent urination. The following conditions increase the risk of asymptomatic bacteriuria:  Diabetes mellitus.  Advanced age.  Pregnancy in the first trimester.  Kidney stones.  Kidney transplants.  Leaky kidney tube valve in young children (reflux). Treatment for this condition is not needed in most people and can lead to other problems such as too much yeast and growth of resistant bacteria. However, some people, such as pregnant women, do need treatment to prevent kidney infection. Asymptomatic bacteriuria in pregnancy is also associated with fetal growth restriction, premature labor, and newborn death. HOME CARE INSTRUCTIONS Monitor your condition for any changes. The following actions may help to relieve any discomfort you are feeling:  Drink enough water and fluids to keep your urine clear or pale yellow. Go to the bathroom more often to keep your bladder empty.  Keep the area around your vagina and rectum clean. Wipe yourself from front to back after urinating. SEEK IMMEDIATE MEDICAL CARE IF:  You develop signs of an infection such as:  Burning with urination.  Frequency of voiding.  Back pain.  Fever.  You have blood in the urine.  You develop a fever. MAKE SURE YOU:  Understand these instructions.  Will watch your condition.  Will get help right away if you are not doing well or get worse.   This information is not intended to replace advice given to you by your health care provider. Make sure you discuss any questions you have with your health care provider.   Document Released: 12/24/2004 Document Revised: 01/14/2014 Document Reviewed: 06/15/2012 Elsevier Interactive Patient Education 2016 [employer] Inc[employer]  

## 2015-06-16 NOTE — Assessment & Plan Note (Signed)
Resolved. UA clean today. Warning signs discussed. Call with any concerns.

## 2015-06-18 LAB — HEPATITIS C ANTIBODY: Hep C Virus Ab: <0.1 s/co ratio (ref 0.0–0.9)

## 2015-06-18 LAB — THYROID PANEL WITH TSH
Free Thyroxine Index: 2.3 (ref 1.2–4.9)
T3 UPTAKE RATIO: 30 % (ref 24–39)
T4 TOTAL: 7.7 ug/dL (ref 4.5–12.0)
TSH: 2.21 u[IU]/mL (ref 0.450–4.500)

## 2015-06-18 LAB — ESTRADIOL

## 2015-06-18 LAB — LUTEINIZING HORMONE: LH: 53.5 m[IU]/mL

## 2015-06-18 LAB — TESTOSTERONE, FREE, TOTAL, SHBG
SEX HORMONE BINDING: 86.2 nmol/L (ref 17.3–125.0)
TESTOSTERONE FREE: 1.9 pg/mL (ref 0.0–4.2)
Testosterone: 13 ng/dL (ref 3–41)

## 2015-06-18 LAB — HIV ANTIBODY (ROUTINE TESTING W REFLEX): HIV Screen 4th Generation wRfx: NONREACTIVE

## 2015-06-18 LAB — PROLACTIN: PROLACTIN: 11.2 ng/mL (ref 4.8–23.3)

## 2015-06-18 LAB — FOLLICLE STIMULATING HORMONE: FSH: 98.4 m[IU]/mL

## 2015-06-18 LAB — DHEA-SULFATE: DHEA SO4: 101.3 ug/dL (ref 41.2–243.7)

## 2015-06-19 ENCOUNTER — Encounter: Payer: Self-pay | Admitting: Family Medicine

## 2015-06-21 ENCOUNTER — Telehealth: Payer: Self-pay | Admitting: Family Medicine

## 2015-06-21 NOTE — Telephone Encounter (Signed)
Called and left a message for patient to let her know that a letter was sent out and that her labs were normal.

## 2015-06-21 NOTE — Telephone Encounter (Signed)
Pt called would like the results from lab work she had done Friday. Please call pt to follow up. Thanks.

## 2015-07-31 ENCOUNTER — Telehealth: Payer: Self-pay | Admitting: Family Medicine

## 2015-07-31 NOTE — Telephone Encounter (Signed)
That's fine

## 2015-07-31 NOTE — Telephone Encounter (Signed)
Pt called stated Dr. Laural Benes told her she could come in and have her urine tested anytime. Wants to know if she can come in tomorrow morning to have this done. Please call pt and advise. Thanks.

## 2015-08-01 ENCOUNTER — Encounter: Payer: Self-pay | Admitting: Family Medicine

## 2015-08-01 ENCOUNTER — Other Ambulatory Visit (INDEPENDENT_AMBULATORY_CARE_PROVIDER_SITE_OTHER): Payer: BLUE CROSS/BLUE SHIELD

## 2015-08-01 DIAGNOSIS — N390 Urinary tract infection, site not specified: Secondary | ICD-10-CM

## 2015-08-01 MED ORDER — CIPROFLOXACIN HCL 250 MG PO TABS: 250.0000 mg | ORAL_TABLET | Freq: Two times a day (BID) | ORAL | 0 refills | Status: DC

## 2015-08-01 NOTE — Progress Notes (Signed)
Patient thinks she has UTI- had pyleo without knowing about it, so would like to come in and leave a urine. Did so this morning, which was quite dirty. Please let her know that she does have a UTI and I've sent the antibiotic to her pharmacy. Let me know if she's not feeling better or if she starts feeling worse. Thanks!

## 2015-08-01 NOTE — Telephone Encounter (Signed)
Pt called asking about her urine tests.  I explained that generally phone calls are done at the end of the day after the clinic is done. Stated she didn't think she should have to wait all day to get her results and maybe she should go elsewhere in the future if she's going to have to wait.  She also insisted I send a message to make sure she got a call.  Please call pt.

## 2015-08-01 NOTE — Telephone Encounter (Signed)
Called patient to let her know that Dr.Johnson sent her a message through my chart, no answer so left this on patient's voicemail.

## 2015-08-04 LAB — MICROSCOPIC EXAMINATION

## 2015-08-04 LAB — UA/M W/RFLX CULTURE, ROUTINE
Bilirubin, UA: NEGATIVE
GLUCOSE, UA: NEGATIVE
NITRITE UA: NEGATIVE
SPEC GRAV UA: 1.02 (ref 1.005–1.030)
Urobilinogen, Ur: 0.2 mg/dL (ref 0.2–1.0)
pH, UA: 5.5 (ref 5.0–7.5)

## 2015-08-04 LAB — URINE CULTURE, REFLEX

## 2016-06-18 ENCOUNTER — Encounter: Payer: Self-pay | Admitting: Family Medicine

## 2016-06-18 ENCOUNTER — Ambulatory Visit (INDEPENDENT_AMBULATORY_CARE_PROVIDER_SITE_OTHER): Payer: BLUE CROSS/BLUE SHIELD | Admitting: Family Medicine

## 2016-06-18 VITALS — BP 128/74 | HR 75 | Temp 98.9°F | Wt 137.0 lb

## 2016-06-18 DIAGNOSIS — B9789 Other viral agents as the cause of diseases classified elsewhere: Secondary | ICD-10-CM

## 2016-06-18 DIAGNOSIS — J069 Acute upper respiratory infection, unspecified: Secondary | ICD-10-CM

## 2016-06-18 MED ORDER — HYDROCOD POLST-CPM POLST ER 10-8 MG/5ML PO SUER
5.0000 mL | Freq: Two times a day (BID) | ORAL | 0 refills | Status: DC | PRN
Start: 2016-06-18 — End: 2016-08-05

## 2016-06-18 MED ORDER — BENZONATATE 100 MG PO CAPS
200.0000 mg | ORAL_CAPSULE | Freq: Three times a day (TID) | ORAL | 0 refills | Status: DC | PRN
Start: 2016-06-18 — End: 2016-08-05

## 2016-06-18 NOTE — Progress Notes (Signed)
   BP 128/74   Pulse 75   Temp 98.9 F (37.2 C)   Wt 137 lb (62.1 kg)   SpO2 96%   BMI 22.11 kg/m    Subjective:    Patient ID: Shelly Tyler, female    DOB: May 02, 1965, 51 y.o.   MRN: [id number]  HPI: Shelly Tyler is a 51 y.o. female  Chief Complaint  Patient presents with  . Cough    x 1 day, non productive cough, chest/head congestion, scratchy throat, sinus drainage, headache, feels bad. No fever. No ear ache. No NVD.   Patient with 1 day hx of dry hacking cough, congestion, sore throat, sinus HA, and malaise. Denies fever, chills, aches, CP, SOB. Took some tylenol but hasn't tried anything else for sxs. No sick contacts.  Relevant past medical, surgical, family and social history reviewed and updated as indicated. Interim medical history since our last visit reviewed. Allergies and medications reviewed and updated.  Review of Systems  Constitutional: Positive for fatigue.  HENT: Positive for congestion and sore throat.   Eyes: Negative.   Respiratory: Positive for cough.   Cardiovascular: Negative.   Gastrointestinal: Negative.   Genitourinary: Negative.   Musculoskeletal: Negative.   Neurological: Positive for headaches.  Psychiatric/Behavioral: Negative.     Per HPI unless specifically indicated above     Objective:    BP 128/74   Pulse 75   Temp 98.9 F (37.2 C)   Wt 137 lb (62.1 kg)   SpO2 96%   BMI 22.11 kg/m   Wt Readings from Last 3 Encounters:  06/18/16 137 lb (62.1 kg)  06/16/15 132 lb (59.9 kg)  06/06/15 128 lb (58.1 kg)    Physical Exam  Constitutional: She is oriented to person, place, and time. She appears well-developed and well-nourished. No distress.  HENT:  Head: Atraumatic.  Right Ear: External ear normal.  Left Ear: External ear normal.  Nose: Nose normal.  Oropharynx erythematous  Eyes: Conjunctivae are normal. Pupils are equal, round, and reactive to light.  Neck: Normal range of motion. Neck supple.  Cardiovascular:  Normal rate and normal heart sounds.   Pulmonary/Chest: Effort normal and breath sounds normal. No respiratory distress. She has no wheezes. She has no rales.  Musculoskeletal: Normal range of motion.  Lymphadenopathy:    She has no cervical adenopathy.  Neurological: She is alert and oriented to person, place, and time.  Skin: Skin is warm and dry.  Psychiatric: She has a normal mood and affect. Her behavior is normal.  Nursing note and vitals reviewed.     Assessment & Plan:   Problem List Items Addressed This Visit    None    Visit Diagnoses    Viral URI with cough    -  Primary   Will treat symptomatically with tessalon, tussionex, and OTC cold medications. Supportive care reviewed. F/u if worsening or no improvement       Follow up plan: Return if symptoms worsen or fail to improve.

## 2016-08-05 ENCOUNTER — Ambulatory Visit (INDEPENDENT_AMBULATORY_CARE_PROVIDER_SITE_OTHER): Payer: BLUE CROSS/BLUE SHIELD | Admitting: Unknown Physician Specialty

## 2016-08-05 ENCOUNTER — Encounter: Payer: Self-pay | Admitting: Unknown Physician Specialty

## 2016-08-05 VITALS — BP 130/77 | HR 72 | Temp 98.3°F | Ht 64.8 in | Wt 137.1 lb

## 2016-08-05 DIAGNOSIS — Z0000 Encounter for general adult medical examination without abnormal findings: Secondary | ICD-10-CM

## 2016-08-05 DIAGNOSIS — Z720 Tobacco use: Secondary | ICD-10-CM | POA: Diagnosis not present

## 2016-08-05 NOTE — Addendum Note (Signed)
Addended by: Gabriel CirriWICKER, Lumen Brinlee on: 08/05/2016 09:12 AM   Modules accepted: Orders

## 2016-08-05 NOTE — Progress Notes (Signed)
BP 130/77   Pulse 72   Temp 98.3 F (36.8 C)   Ht 5' 4.8" (1.646 m)   Wt 137 lb 1.6 oz (62.2 kg)   SpO2 97%   BMI 22.96 kg/m    Subjective:    Patient ID: Shelly Tyler, female    DOB: 1965/10/05, 51 y.o.   MRN: [id number]  HPI: Shelly Tyler is a 51 y.o. female  Chief Complaint  Patient presents with  . Annual Exam   Pt with chief complaint of menopausal weight gain and poor libido  Smoking Uses a vape and has cut back.    Social History   Social History  . Marital status: Married    Spouse name: N/A  . Number of children: N/A  . Years of education: N/A   Occupational History  . Not on file.   Social History Main Topics  . Smoking status: Current Every Day Smoker    Packs/day: 0.50    Types: Cigarettes  . Smokeless tobacco: Never Used  . Alcohol use No  . Drug use: No  . Sexual activity: Yes   Other Topics Concern  . Not on file   Social History Narrative  . No narrative on file   Family History  Problem Relation Age of Onset  . Diabetes type II Mother   . Cancer Mother        lung  . Diabetes Mother   . Hypertension Mother   . Osteoporosis Mother   . Alcohol abuse Father   . Cancer Paternal Grandmother        lung  . Alzheimer's disease Maternal Grandmother    Past Medical History:  Diagnosis Date  . Anxiety   . Depression   . Migraine   . OCD (obsessive compulsive disorder)   . Tobacco abuse    Social History   Social History  . Marital status: Married    Spouse name: N/A  . Number of children: N/A  . Years of education: N/A   Occupational History  . Not on file.   Social History Main Topics  . Smoking status: Current Every Day Smoker    Packs/day: 0.50    Types: Cigarettes  . Smokeless tobacco: Never Used  . Alcohol use No  . Drug use: No  . Sexual activity: Yes   Other Topics Concern  . Not on file   Social History Narrative  . No narrative on file    Relevant past medical, surgical, family and social  history reviewed and updated as indicated. Interim medical history since our last visit reviewed. Allergies and medications reviewed and updated.  Review of Systems  Per HPI unless specifically indicated above     Objective:    BP 130/77   Pulse 72   Temp 98.3 F (36.8 C)   Ht 5' 4.8" (1.646 m)   Wt 137 lb 1.6 oz (62.2 kg)   SpO2 97%   BMI 22.96 kg/m   Wt Readings from Last 3 Encounters:  08/05/16 137 lb 1.6 oz (62.2 kg)  06/18/16 137 lb (62.1 kg)  06/16/15 132 lb (59.9 kg)    Physical Exam  Constitutional: She is oriented to person, place, and time. She appears well-developed and well-nourished.  HENT:  Head: Normocephalic and atraumatic.  Eyes: Pupils are equal, round, and reactive to light. Right eye exhibits no discharge. Left eye exhibits no discharge. No scleral icterus.  Neck: Normal range of motion. Neck supple. Carotid bruit is not present.  No thyromegaly present.  Cardiovascular: Normal rate, regular rhythm and normal heart sounds.  Exam reveals no gallop and no friction rub.   No murmur heard. Pulmonary/Chest: Effort normal and breath sounds normal. No respiratory distress. She has no wheezes. She has no rales.  Abdominal: Soft. Bowel sounds are normal. There is no tenderness. There is no rebound.  Genitourinary: Vagina normal and uterus normal. No breast swelling, tenderness or discharge. Cervix exhibits no motion tenderness, no discharge and no friability. Right adnexum displays no mass, no tenderness and no fullness. Left adnexum displays no mass, no tenderness and no fullness.  Musculoskeletal: Normal range of motion.  Lymphadenopathy:    She has no cervical adenopathy.  Neurological: She is alert and oriented to person, place, and time.  Skin: Skin is warm, dry and intact. No rash noted.  Psychiatric: She has a normal mood and affect. Her speech is normal and behavior is normal. Judgment and thought content normal. Cognition and memory are normal.        Assessment & Plan:   Problem List Items Addressed This Visit      Unprioritized   Tobacco abuse    Cut back using a vape.  Not ready to quit at this time.   I have recommended absolute tobacco cessation. I have discussed various options available for assistance with tobacco cessation including over the counter methods (Nicotine gum, patch and lozenges). We also discussed prescription options (Chantix, Nicotine Inhaler / Nasal Spray). The patient is not interested in pursuing any prescription tobacco cessation options at this time.        Other Visit Diagnoses    Annual physical exam    -  Primary   Relevant Orders   CBC with Differential/Platelet   Comprehensive metabolic panel   IGP, Aptima HPV, rfx 16/18,45   Lipid Panel w/o Chol/HDL Ratio   MM DIGITAL SCREENING BILATERAL   TSH   VITAMIN D 25 Hydroxy (Vit-D Deficiency, Fractures)       Follow up plan: Return in about 1 year (around 08/05/2017).

## 2016-08-05 NOTE — Patient Instructions (Addendum)
Preventive Care 40-64 Years, Female Preventive care refers to lifestyle choices and visits with your health care provider that can promote health and wellness. What does preventive care include?  A yearly physical exam. This is also called an annual well check.  Dental exams once or twice a year.  Routine eye exams. Ask your health care provider how often you should have your eyes checked.  Personal lifestyle choices, including: ? Daily care of your teeth and gums. ? Regular physical activity. ? Eating a healthy diet. ? Avoiding tobacco and drug use. ? Limiting alcohol use. ? Practicing safe sex. ? Taking low-dose aspirin daily starting at age 58. ? Taking vitamin and mineral supplements as recommended by your health care provider. What happens during an annual well check? The services and screenings done by your health care provider during your annual well check will depend on your age, overall health, lifestyle risk factors, and family history of disease. Counseling Your health care provider may ask you questions about your:  Alcohol use.  Tobacco use.  Drug use.  Emotional well-being.  Home and relationship well-being.  Sexual activity.  Eating habits.  Work and work Statistician.  Method of birth control.  Menstrual cycle.  Pregnancy history.  Screening You may have the following tests or measurements:  Height, weight, and BMI.  Blood pressure.  Lipid and cholesterol levels. These may be checked every 5 years, or more frequently if you are over 81 years old.  Skin check.  Lung cancer screening. You may have this screening every year starting at age 78 if you have a 30-pack-year history of smoking and currently smoke or have quit within the past 15 years.  Fecal occult blood test (FOBT) of the stool. You may have this test every year starting at age 65.  Flexible sigmoidoscopy or colonoscopy. You may have a sigmoidoscopy every 5 years or a colonoscopy  every 10 years starting at age 30.  Hepatitis C blood test.  Hepatitis B blood test.  Sexually transmitted disease (STD) testing.  Diabetes screening. This is done by checking your blood sugar (glucose) after you have not eaten for a while (fasting). You may have this done every 1-3 years.  Mammogram. This may be done every 1-2 years. Talk to your health care provider about when you should start having regular mammograms. This may depend on whether you have a family history of breast cancer.  BRCA-related cancer screening. This may be done if you have a family history of breast, ovarian, tubal, or peritoneal cancers.  Pelvic exam and Pap test. This may be done every 3 years starting at age 80. Starting at age 36, this may be done every 5 years if you have a Pap test in combination with an HPV test.  Bone density scan. This is done to screen for osteoporosis. You may have this scan if you are at high risk for osteoporosis.  Discuss your test results, treatment options, and if necessary, the need for more tests with your health care provider. Vaccines Your health care provider may recommend certain vaccines, such as:  Influenza vaccine. This is recommended every year.  Tetanus, diphtheria, and acellular pertussis (Tdap, Td) vaccine. You may need a Td booster every 10 years.  Varicella vaccine. You may need this if you have not been vaccinated.  Zoster vaccine. You may need this after age 5.  Measles, mumps, and rubella (MMR) vaccine. You may need at least one dose of MMR if you were born in  1957 or later. You may also need a second dose.  Pneumococcal 13-valent conjugate (PCV13) vaccine. You may need this if you have certain conditions and were not previously vaccinated.  Pneumococcal polysaccharide (PPSV23) vaccine. You may need one or two doses if you smoke cigarettes or if you have certain conditions.  Meningococcal vaccine. You may need this if you have certain  conditions.  Hepatitis A vaccine. You may need this if you have certain conditions or if you travel or work in places where you may be exposed to hepatitis A.  Hepatitis B vaccine. You may need this if you have certain conditions or if you travel or work in places where you may be exposed to hepatitis B.  Haemophilus influenzae type b (Hib) vaccine. You may need this if you have certain conditions.  Talk to your health care provider about which screenings and vaccines you need and how often you need them. This information is not intended to replace advice given to you by your health care provider. Make sure you discuss any questions you have with your health care provider.  --------------------------------------------------------------------- Quit smoking class [phone]

## 2016-08-05 NOTE — Assessment & Plan Note (Addendum)
Cut back using a vape.  Not ready to quit at this time.   I have recommended absolute tobacco cessation. I have discussed various options available for assistance with tobacco cessation including over the counter methods (Nicotine gum, patch and lozenges). We also discussed prescription options (Chantix, Nicotine Inhaler / Nasal Spray). The patient is not interested in pursuing any prescription tobacco cessation options at this time.

## 2016-08-06 LAB — COMPREHENSIVE METABOLIC PANEL
A/G RATIO: 2.1 (ref 1.2–2.2)
ALBUMIN: 4.4 g/dL (ref 3.5–5.5)
ALT: 11 IU/L (ref 0–32)
AST: 17 IU/L (ref 0–40)
Alkaline Phosphatase: 80 IU/L (ref 39–117)
BUN/Creatinine Ratio: 18 (ref 9–23)
BUN: 13 mg/dL (ref 6–24)
Bilirubin Total: 0.5 mg/dL (ref 0.0–1.2)
CALCIUM: 9.9 mg/dL (ref 8.7–10.2)
CO2: 23 mmol/L (ref 20–29)
CREATININE: 0.71 mg/dL (ref 0.57–1.00)
Chloride: 102 mmol/L (ref 96–106)
GFR, EST AFRICAN AMERICAN: 113 mL/min/{1.73_m2} (ref >59)
GFR, EST NON AFRICAN AMERICAN: 98 mL/min/{1.73_m2} (ref >59)
GLOBULIN, TOTAL: 2.1 g/dL (ref 1.5–4.5)
Glucose: 101 mg/dL — ABNORMAL HIGH (ref 65–99)
POTASSIUM: 4.1 mmol/L (ref 3.5–5.2)
SODIUM: 141 mmol/L (ref 134–144)
TOTAL PROTEIN: 6.5 g/dL (ref 6.0–8.5)

## 2016-08-06 LAB — CBC WITH DIFFERENTIAL/PLATELET
BASOS ABS: 0 10*3/uL (ref 0.0–0.2)
Basos: 1 %
EOS (ABSOLUTE): 0.1 10*3/uL (ref 0.0–0.4)
Eos: 1 %
Hematocrit: 41.6 % (ref 34.0–46.6)
Hemoglobin: 13.5 g/dL (ref 11.1–15.9)
IMMATURE GRANS (ABS): 0 10*3/uL (ref 0.0–0.1)
Immature Granulocytes: 0 %
LYMPHS ABS: 2.3 10*3/uL (ref 0.7–3.1)
LYMPHS: 28 %
MCH: 31 pg (ref 26.6–33.0)
MCHC: 32.5 g/dL (ref 31.5–35.7)
MCV: 96 fL (ref 79–97)
Monocytes Absolute: 0.5 10*3/uL (ref 0.1–0.9)
Monocytes: 7 %
NEUTROS ABS: 5.2 10*3/uL (ref 1.4–7.0)
Neutrophils: 63 %
Platelets: 404 10*3/uL — ABNORMAL HIGH (ref 150–379)
RBC: 4.35 x10E6/uL (ref 3.77–5.28)
RDW: 13.5 % (ref 12.3–15.4)
WBC: 8.2 10*3/uL (ref 3.4–10.8)

## 2016-08-06 LAB — LIPID PANEL W/O CHOL/HDL RATIO
Cholesterol, Total: 173 mg/dL (ref 100–199)
HDL: 57 mg/dL (ref >39)
LDL CALC: 87 mg/dL (ref 0–99)
TRIGLYCERIDES: 144 mg/dL (ref 0–149)
VLDL Cholesterol Cal: 29 mg/dL (ref 5–40)

## 2016-08-06 LAB — TSH: TSH: 2.83 u[IU]/mL (ref 0.450–4.500)

## 2016-08-06 LAB — VITAMIN D 25 HYDROXY (VIT D DEFICIENCY, FRACTURES): Vit D, 25-Hydroxy: 39.2 ng/mL (ref 30.0–100.0)

## 2016-08-06 NOTE — Progress Notes (Signed)
Normal labs.  Pt notified through mychart

## 2016-08-07 ENCOUNTER — Telehealth: Payer: Self-pay | Admitting: Unknown Physician Specialty

## 2016-08-07 LAB — IGP, APTIMA HPV, RFX 16/18,45
HPV APTIMA: NEGATIVE
PAP SMEAR COMMENT: 0

## 2016-08-07 MED ORDER — CIPROFLOXACIN HCL 250 MG PO TABS: 250.0000 mg | ORAL_TABLET | Freq: Two times a day (BID) | ORAL | 0 refills | Status: DC

## 2016-08-07 NOTE — Telephone Encounter (Signed)
Called about UTI.  Antibiotic prescribed

## 2016-08-10 LAB — UA/M W/RFLX CULTURE, ROUTINE
Bilirubin, UA: NEGATIVE
Glucose, UA: NEGATIVE
Ketones, UA: NEGATIVE
LEUKOCYTES UA: NEGATIVE
Nitrite, UA: NEGATIVE
PH UA: 6 (ref 5.0–7.5)
Protein, UA: NEGATIVE
Specific Gravity, UA: 1.01 (ref 1.005–1.030)
Urobilinogen, Ur: 0.2 mg/dL (ref 0.2–1.0)

## 2016-08-10 LAB — MICROSCOPIC EXAMINATION

## 2016-08-10 LAB — URINE CULTURE, REFLEX

## 2016-08-12 NOTE — Progress Notes (Signed)
Normal labs.  Pt notified through mychart

## 2016-08-31 ENCOUNTER — Encounter: Payer: Self-pay | Admitting: Emergency Medicine

## 2016-08-31 ENCOUNTER — Emergency Department
Admission: EM | Admit: 2016-08-31 | Discharge: 2016-08-31 | Disposition: A | Discharge status: Home / Self Care | Payer: BLUE CROSS/BLUE SHIELD | Attending: Emergency Medicine | Admitting: Emergency Medicine

## 2016-08-31 ENCOUNTER — Emergency Department: Payer: BLUE CROSS/BLUE SHIELD

## 2016-08-31 DIAGNOSIS — F17210 Nicotine dependence, cigarettes, uncomplicated: Secondary | ICD-10-CM | POA: Diagnosis not present

## 2016-08-31 DIAGNOSIS — Z79899 Other long term (current) drug therapy: Secondary | ICD-10-CM | POA: Insufficient documentation

## 2016-08-31 DIAGNOSIS — R079 Chest pain, unspecified: Secondary | ICD-10-CM | POA: Diagnosis not present

## 2016-08-31 LAB — BASIC METABOLIC PANEL
Anion gap: 7 (ref 5–15)
BUN: 15 mg/dL (ref 6–20)
CHLORIDE: 108 mmol/L (ref 101–111)
CO2: 26 mmol/L (ref 22–32)
CREATININE: 0.74 mg/dL (ref 0.44–1.00)
Calcium: 10.2 mg/dL (ref 8.9–10.3)
GFR calc Af Amer: >60 mL/min (ref >60)
GFR calc non Af Amer: >60 mL/min (ref >60)
GLUCOSE: 85 mg/dL (ref 65–99)
Potassium: 4 mmol/L (ref 3.5–5.1)
SODIUM: 141 mmol/L (ref 135–145)

## 2016-08-31 LAB — CBC
HCT: 37 % (ref 35.0–47.0)
Hemoglobin: 12.9 g/dL (ref 12.0–16.0)
MCH: 32.3 pg (ref 26.0–34.0)
MCHC: 34.8 g/dL (ref 32.0–36.0)
MCV: 92.9 fL (ref 80.0–100.0)
PLATELETS: 367 10*3/uL (ref 150–440)
RBC: 3.98 MIL/uL (ref 3.80–5.20)
RDW: 13.4 % (ref 11.5–14.5)
WBC: 10.1 10*3/uL (ref 3.6–11.0)

## 2016-08-31 LAB — TROPONIN I: Troponin I: <0.03 ng/mL (ref <0.03)

## 2016-08-31 MED ORDER — IBUPROFEN 600 MG PO TABS: 600.0000 mg | ORAL_TABLET | Freq: Three times a day (TID) | ORAL | 0 refills | Status: DC | PRN

## 2016-08-31 NOTE — Discharge Instructions (Signed)
Please seek medical attention for any high fevers, chest pain, shortness of breath, change in behavior, persistent vomiting, bloody stool or any other new or concerning symptoms.  

## 2016-08-31 NOTE — ED Provider Notes (Signed)
[facility] Provider Note   ____________________________________________   I have reviewed the triage vital signs and the nursing notes.   HISTORY  Chief Complaint Chest Pain   History limited by: Not Limited   HPI Shelly Tyler is a 51 y.o. female who presents to the emergency department today because of concerns for chest pain. It is located in the left side. She describes it as pressure-like. The patient states that it started today. It started when she was gardening. It only occurs when she is bending forward. She has not had any associated shortness of breath. She denies any diaphoresis or fevers. She states she did fall couple of days ago and thinks that the pain might be related to the fall.   Past Medical History:  Diagnosis Date  . Anxiety   . Depression   . Migraine   . OCD (obsessive compulsive disorder)   . Tobacco abuse     Patient Active Problem List   Diagnosis Date Noted  . Tobacco abuse 03/03/2015  . Depression 03/03/2015  . Acute anxiety 03/03/2015  . Cellulitis 02/26/2015    Past Surgical History:  Procedure Laterality Date  . BREAST BIOPSY    . BUNIONECTOMY    . CESAREAN SECTION      Prior to Admission medications   Medication Sig Start Date End Date Taking? Authorizing Provider  acetaminophen (TYLENOL) 325 MG tablet Take by mouth.    [provider]  ciprofloxacin (CIPRO) 250 MG tablet Take 1 tablet (250 mg total) by mouth 2 (two) times daily. 08/07/16   Gabriel Cirri, NP    Allergies Patient has no known allergies.  Family History  Problem Relation Age of Onset  . Diabetes type II Mother   . Cancer Mother        lung  . Diabetes Mother   . Hypertension Mother   . Osteoporosis Mother   . Alcohol abuse Father   . Cancer Paternal Grandmother        lung  . Alzheimer's disease Maternal Grandmother     Social History Social History  Substance Use Topics  . Smoking status:  Current Every Day Smoker    Packs/day: 0.50    Types: Cigarettes  . Smokeless tobacco: Never Used  . Alcohol use No    Review of Systems Constitutional: No fever/chills Eyes: No visual changes. ENT: No sore throat. Cardiovascular: Positive for chest pain. Respiratory: Denies shortness of breath. Gastrointestinal: No abdominal pain.  No nausea, no vomiting.  No diarrhea.   Genitourinary: Negative for dysuria. Musculoskeletal: Negative for back pain. Skin: Negative for rash. Neurological: Negative for headaches, focal weakness or numbness.  ____________________________________________   PHYSICAL EXAM:  VITAL SIGNS: ED Triage Vitals [08/31/16 1727]  Enc Vitals Group     BP 112/76     Pulse Rate 71     Resp 16     Temp 99.1 F (37.3 C)     Temp Source Oral     SpO2 97 %     Weight      Height      Head Circumference      Peak Flow      Pain Score 5   Constitutional: Alert and oriented. Well appearing and in no distress. Eyes: Conjunctivae are normal.  ENT   Head: Normocephalic and atraumatic.   Nose: No congestion/rhinnorhea.   Mouth/Throat: Mucous membranes are moist.   Neck: No stridor. Hematological/Lymphatic/Immunilogical: No cervical lymphadenopathy. Cardiovascular: Normal rate, regular  rhythm.  No murmurs, rubs, or gallops.  Respiratory: Normal respiratory effort without tachypnea nor retractions. Breath sounds are clear and equal bilaterally. No wheezes/rales/rhonchi. Gastrointestinal: Soft and non tender. No rebound. No guarding.  Genitourinary: Deferred Musculoskeletal: Normal range of motion in all extremities. No lower extremity edema. Some tenderness with palpation over the left chest wall which the patient states reproduces her pain Neurologic:  Normal speech and language. No gross focal neurologic deficits are appreciated.  Skin:  Skin is warm, dry and intact. No rash noted. Psychiatric: Mood and affect are normal. Speech and behavior are  normal. Patient exhibits appropriate insight and judgment.  ____________________________________________    LABS (pertinent positives/negatives)  Labs Reviewed  BASIC METABOLIC PANEL  CBC  TROPONIN I     ____________________________________________   EKG  I, Phineas Semen, attending physician, personally viewed and interpreted this EKG  EKG Time: 1724 Rate: 68 Rhythm: normal sinus rhythm Axis: normal Intervals: qtc 397 QRS: narrow ST changes: no st elevation Impression: normal ekg  ____________________________________________    RADIOLOGY  CXR  IMPRESSION: Hyperinflation. No active cardiopulmonary disease.   ____________________________________________   PROCEDURES  Procedures  ____________________________________________   INITIAL IMPRESSION / ASSESSMENT AND PLAN / ED COURSE  Pertinent labs & imaging results that were available during my care of the patient were reviewed by me and considered in my medical decision making (see chart for details).  Patient presented to the emergency department today because of concerns for chest pain. Troponin EKG and chest x-ray without any concerning findings. At this point I think muscle skeletal pain likely. It is not a good story for coronary artery disease. She was tender to palpation which reproduced her pain. This certainly could be related to the patient's fall. Will give high-dose ibuprofen. Discussed return precautions.  ____________________________________________   FINAL CLINICAL IMPRESSION(S) / ED DIAGNOSES  Final diagnoses:  Chest pain, unspecified type     Note: This dictation was prepared with Dragon dictation. Any transcriptional errors that result from this process are unintentional     Phineas Semen, MD 08/31/16 1932

## 2016-08-31 NOTE — ED Triage Notes (Signed)
Pt states that when she was out working in the yard today she started having left sided chest pain. Pt denies any other symptoms or any radiation of the pain. Pt states that she is still having the chest pain when she leans forward. Pt is not in any distress at this time.

## 2016-10-24 ENCOUNTER — Encounter: Payer: Self-pay | Admitting: Unknown Physician Specialty

## 2016-10-31 MED ORDER — BUPROPION HCL ER (XL) 150 MG PO TB24: 150.0000 mg | ORAL_TABLET | Freq: Every day | ORAL | 0 refills | Status: DC

## 2016-11-22 ENCOUNTER — Ambulatory Visit: Payer: BLUE CROSS/BLUE SHIELD | Admitting: Unknown Physician Specialty

## 2016-11-27 IMAGING — CT CT NECK W/ CM
4 of 5 series · 14 of 33 positions shown, 16 images · IV contrast (omnipaque)
Comparison: None.

CLINICAL DATA: Soft tissue swelling chin. Rule out abscess. On
antibiotics.

EXAM:
CT NECK WITH CONTRAST
TECHNIQUE: Multidetector CT imaging of the neck was performed using the
standard protocol following the bolus administration of intravenous
contrast.
CONTRAST:  75mL OMNIPAQUE IOHEXOL 300 MG/ML  SOLN

[Series 2: axial neck · axial · 0.49mm/px · z∈[-5,+145]mm · 4 of 126 slices shown, 5 images]
[im 26/126  soft-tissue]
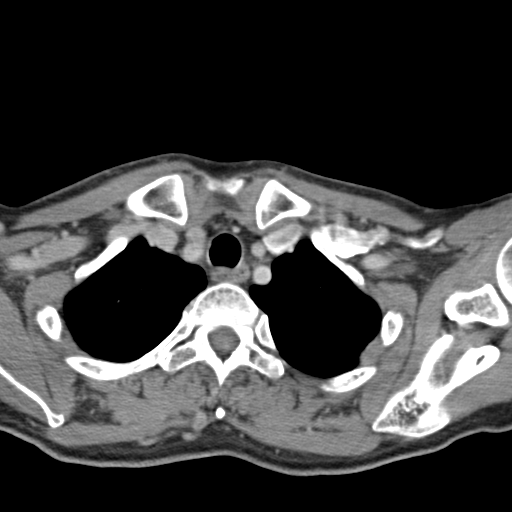
[im 26/126  bone]
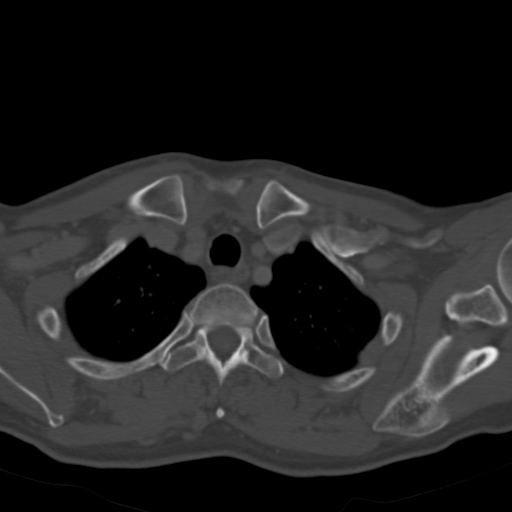
[im 51/126  bone]
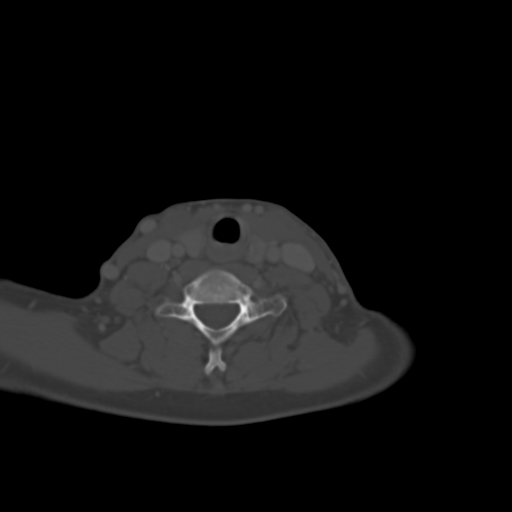
[im 76/126  bone]
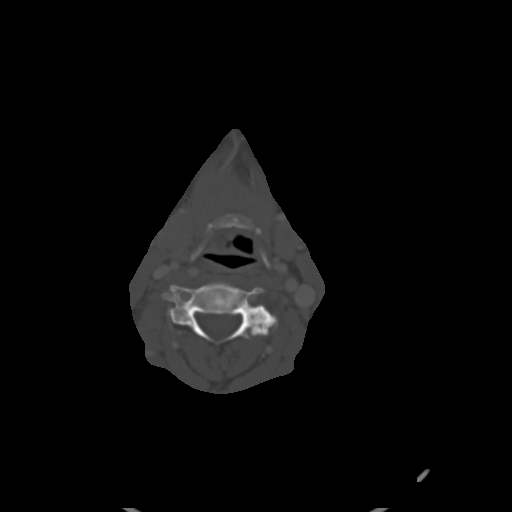
[im 101/126  bone]
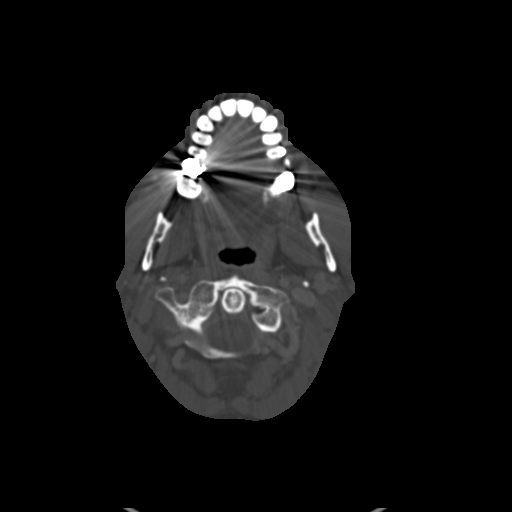

[Series 3: sag neck · sagittal · 0.37mm/px · 5 of 113 slices shown, 6 images]
[im 38/113  bone]
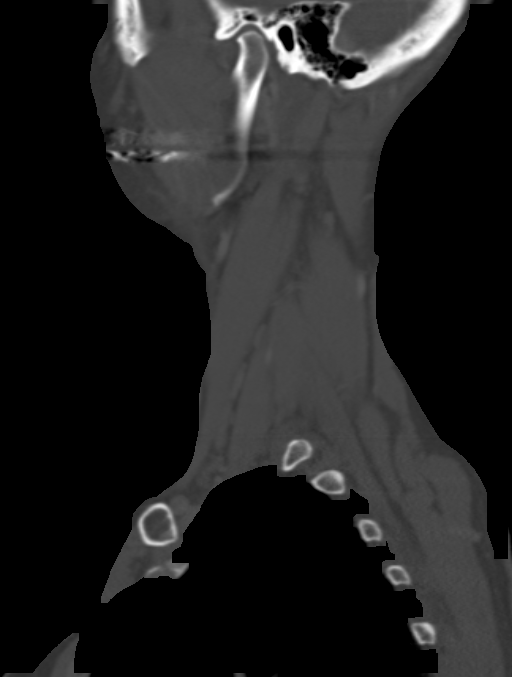
[im 47/113  bone]
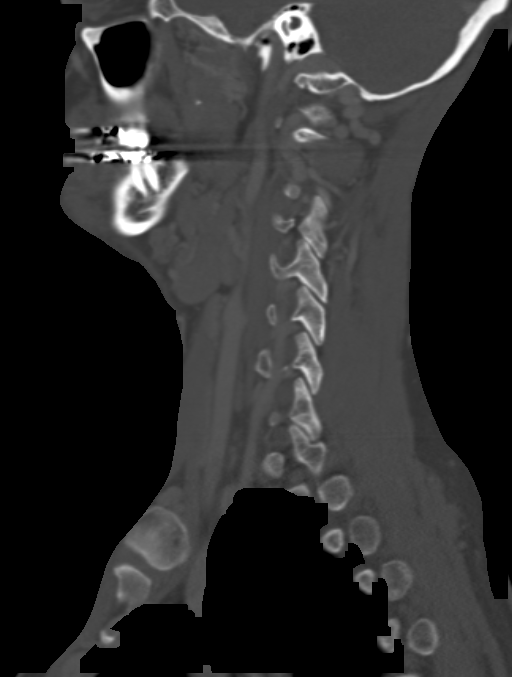
[im 57/113  soft-tissue]
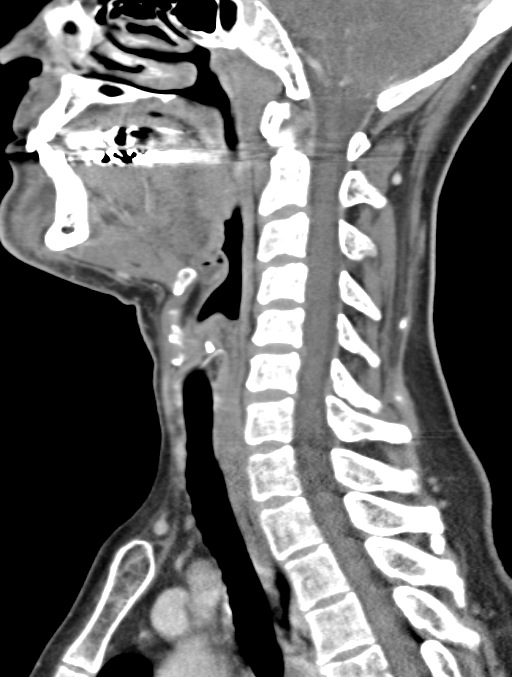
[im 57/113  bone]
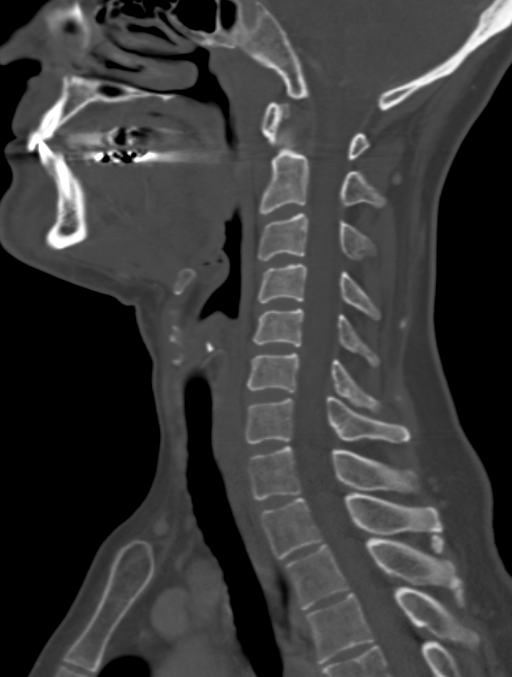
[im 66/113  bone]
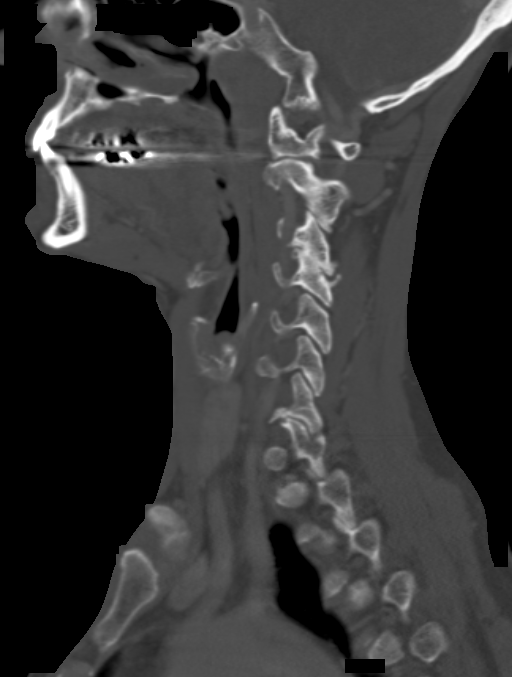
[im 75/113  bone]
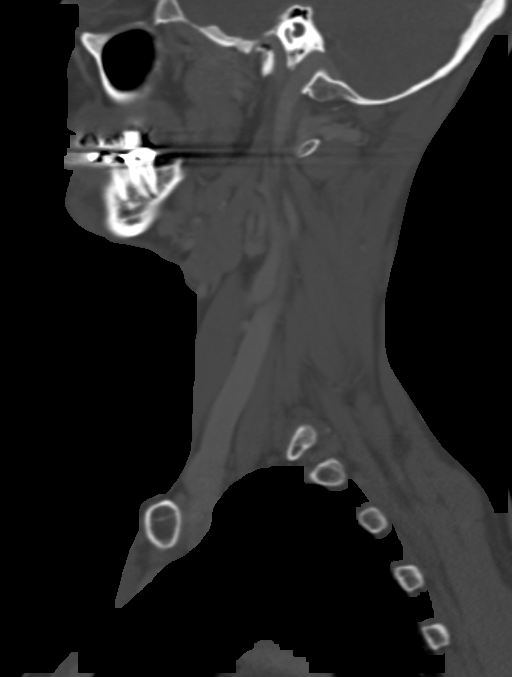

[Series 4: cor neck · coronal · 0.43mm/px · 3 of 100 slices shown]
[im 20/100  bone]
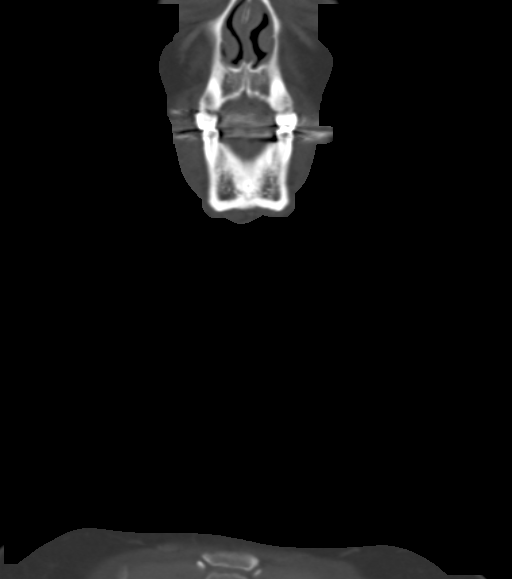
[im 40/100  bone]
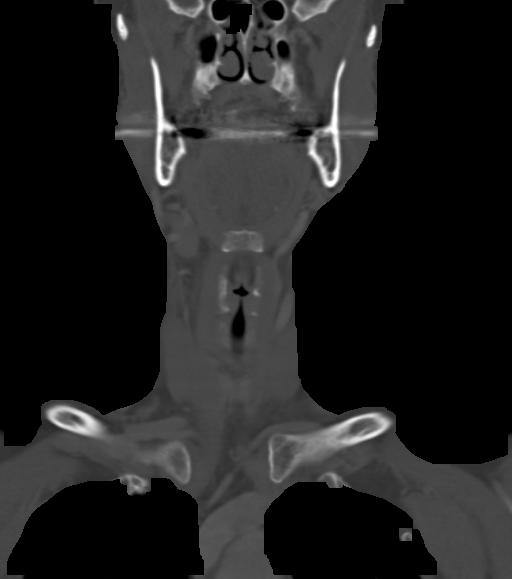
[im 60/100  bone]
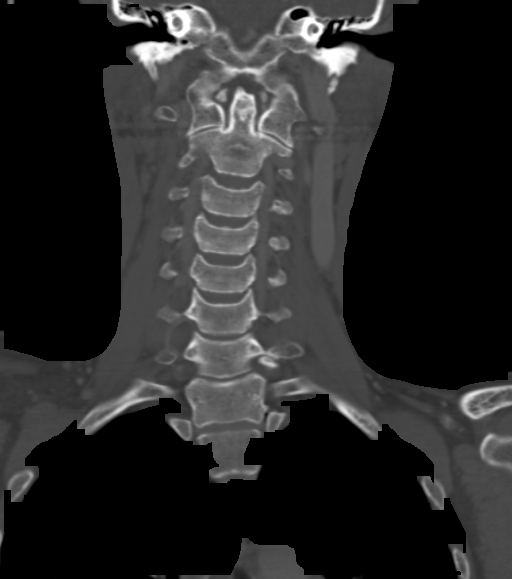

[Series 5: ax oropharynx · axial · 0.37mm/px · z∈[-6,+44]mm · 2 of 124 slices shown]
[im 25/124  bone]
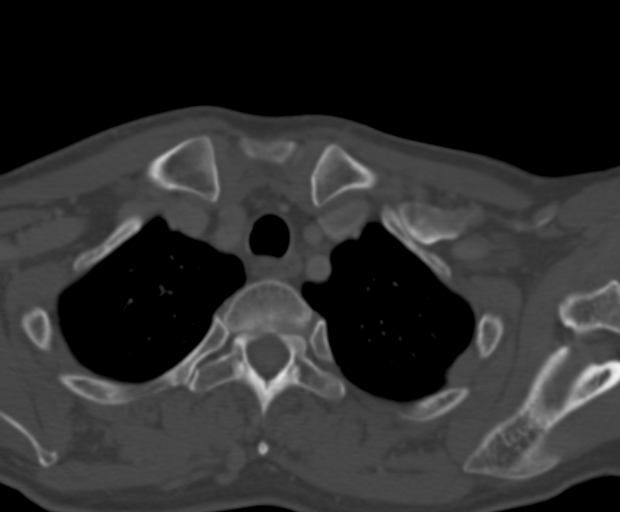
[im 50/124  bone]
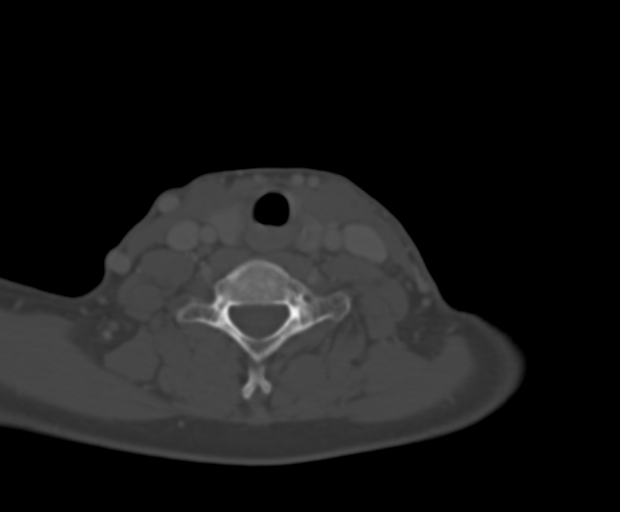

[14 of 33 positions shown; findings below may reference images not displayed]

FINDINGS: Soft tissue swelling anterior to the mandibular symphysis on the
right. This shows soft tissue density. No abscess. No acute dental
infection. No associated bony abnormality of the mandible. There is
diffuse skin thickening in this area

Pharynx and larynx: Enlargement of the right tonsil with solid
enhancement. No abscess. This could be asymmetric tonsillar
hypertrophy due to infection versus tumor. Direct visualization
recommended. Left tonsil normal. Airway normal. Epiglottis and
larynx normal.

Salivary glands: Parotid and submandibular glands normal
bilaterally. No mass or edema.

Thyroid: Normal

Lymph nodes: No pathologic adenopathy in the neck. Right level 2/ 3
lymph nodes 7.4 mm. 7 mm right submandibular lymph node.

Vascular: Negative

Limited intracranial: Negative

Visualized orbits: Negative

Mastoids and visualized paranasal sinuses: Negative

Skeleton: No fracture. No focal bony lesion. Facet degeneration and
spurring on the left at C3-4.

Upper chest: Advanced apical emphysema.  No apical lung mass.
IMPRESSION: Soft tissue swelling anterior to the mandible to the right of
midline most consistent with cellulitis. No abscess.

Asymmetric enlargement right tonsil. Differential includes chronic
infection versus tumor. ENT visualization of this area recommended.

Apical emphysema.

## 2016-11-29 ENCOUNTER — Encounter: Payer: Self-pay | Admitting: Unknown Physician Specialty

## 2016-11-29 ENCOUNTER — Ambulatory Visit: Payer: BLUE CROSS/BLUE SHIELD | Admitting: Unknown Physician Specialty

## 2016-11-29 VITALS — BP 123/78 | HR 69 | Temp 98.3°F | Wt 138.8 lb

## 2016-11-29 DIAGNOSIS — F322 Major depressive disorder, single episode, severe without psychotic features: Secondary | ICD-10-CM | POA: Diagnosis not present

## 2016-11-29 DIAGNOSIS — S80812A Abrasion, left lower leg, initial encounter: Secondary | ICD-10-CM | POA: Diagnosis not present

## 2016-11-29 DIAGNOSIS — Z23 Encounter for immunization: Secondary | ICD-10-CM | POA: Diagnosis not present

## 2016-11-29 MED ORDER — VENLAFAXINE HCL ER 37.5 MG PO CP24: 75.0000 mg | ORAL_CAPSULE | Freq: Every day | ORAL | 2 refills | Status: DC

## 2016-11-29 NOTE — Patient Instructions (Signed)
Td Vaccine (Tetanus and Diphtheria): What You Need to Know 1. Why get vaccinated? Tetanus  and diphtheria are very serious diseases. They are rare in the [place] today, but people who do become infected often have severe complications. Td vaccine is used to protect adolescents and adults from both of these diseases. Both tetanus and diphtheria are infections caused by bacteria. Diphtheria spreads from person to person through coughing or sneezing. Tetanus-causing bacteria enter the body through cuts, scratches, or wounds. TETANUS (lockjaw) causes painful muscle tightening and stiffness, usually all over the body.  It can lead to tightening of muscles in the head and neck so you can't open your mouth, swallow, or sometimes even breathe. Tetanus kills about 1 out of every 10 people who are infected even after receiving the best medical care.  DIPHTHERIA can cause a thick coating to form in the back of the throat.  It can lead to breathing problems, paralysis, heart failure, and death.  Before vaccines, as many as 200,000 cases of diphtheria and hundreds of cases of tetanus were reported in the [place] each year. Since vaccination began, reports of cases for both diseases have dropped by about 99%. 2. Td vaccine Td vaccine can protect adolescents and adults from tetanus and diphtheria. Td is usually given as a booster dose every 10 years but it can also be given earlier after a severe and dirty wound or burn. Another vaccine, called Tdap, which protects against pertussis in addition to tetanus and diphtheria, is sometimes recommended instead of Td vaccine. Your doctor or the person giving you the vaccine can give you more information. Td may safely be given at the same time as other vaccines. 3. Some people should not get this vaccine  A person who has ever had a life-threatening allergic reaction after a previous dose of any tetanus or diphtheria containing vaccine, OR has a severe  allergy to any part of this vaccine, should not get Td vaccine. Tell the person giving the vaccine about any severe allergies.  Talk to your doctor if you: ? had severe pain or swelling after any vaccine containing diphtheria or tetanus, ? ever had a condition called Guillain Barre Syndrome (GBS), ? aren't feeling well on the day the shot is scheduled. 4. What are the risks from Td vaccine? With any medicine, including vaccines, there is a chance of side effects. These are usually mild and go away on their own. Serious reactions are also possible but are rare. Most people who get Td vaccine do not have any problems with it. Mild problems following Td vaccine: (Did not interfere with activities)  Pain where the shot was given (about 8 people in 10)  Redness or swelling where the shot was given (about 1 person in 4)  Mild fever (rare)  Headache (about 1 person in 4)  Tiredness (about 1 person in 4)  Moderate problems following Td vaccine: (Interfered with activities, but did not require medical attention)  Fever over 102F (rare)  Severe problems following Td vaccine: (Unable to perform usual activities; required medical attention)  Swelling, severe pain, bleeding and/or redness in the arm where the shot was given (rare).  Problems that could happen after any vaccine:  People sometimes faint after a medical procedure, including vaccination. Sitting or lying down for about 15 minutes can help prevent fainting, and injuries caused by a fall. Tell your doctor if you feel dizzy, or have vision changes or ringing in the ears.  Some people get   severe pain in the shoulder and have difficulty moving the arm where a shot was given. This happens very rarely.  Any medication can cause a severe allergic reaction. Such reactions from a vaccine are very rare, estimated at fewer than 1 in a million doses, and would happen within a few minutes to a few hours after the vaccination. As with any  medicine, there is a very remote chance of a vaccine causing a serious injury or death. The safety of vaccines is always being monitored. For more information, visit: www.cdc.gov/vaccinesafety/ 5. What if there is a serious reaction? What should I look for? Look for anything that concerns you, such as signs of a severe allergic reaction, very high fever, or unusual behavior. Signs of a severe allergic reaction can include hives, swelling of the face and throat, difficulty breathing, a fast heartbeat, dizziness, and weakness. These would usually start a few minutes to a few hours after the vaccination. What should I do?  If you think it is a severe allergic reaction or other emergency that can't wait, call 9-1-1 or get the person to the nearest hospital. Otherwise, call your doctor.  Afterward, the reaction should be reported to the Vaccine Adverse Event Reporting System (VAERS). Your doctor might file this report, or you can do it yourself through the VAERS web site at www.vaers.hhs.gov, or by calling 1-800-822-7967. ? VAERS does not give medical advice. 6. The National Vaccine Injury Compensation Program The National Vaccine Injury Compensation Program (VICP) is a federal program that was created to compensate people who may have been injured by certain vaccines. Persons who believe they may have been injured by a vaccine can learn about the program and about filing a claim by calling 1-800-338-2382 or visiting the VICP website at www.hrsa.gov/vaccinecompensation. There is a time limit to file a claim for compensation. 7. How can I learn more?  Ask your doctor. He or she can give you the vaccine package insert or suggest other sources of information.  Call your local or state health department.  Contact the Centers for Disease Control and Prevention (CDC): ? Call 1-800-232-4636 (1-800-CDC-INFO) ? Visit CDC's website at www.cdc.gov/vaccines CDC Td Vaccine VIS (04/18/15) This information is  not intended to replace advice given to you by your health care provider. Make sure you discuss any questions you have with your health care provider. Document Released: 10/21/2005 Document Revised: 09/14/2015 Document Reviewed: 09/14/2015 Elsevier Interactive Patient Education  2017 Elsevier Inc.  

## 2016-11-29 NOTE — Progress Notes (Signed)
BP 123/78   Pulse 69   Temp 98.3 F (36.8 C) (Oral)   Wt 138 lb 12.8 oz (63 kg)   SpO2 99%   BMI 23.24 kg/m    Subjective:    Patient ID: Shelly Tyler, female    DOB: April 05, 1965, 51 y.o.   MRN: [id number]  HPI: Shelly Tyler is a 51 y.o. female  Chief Complaint  Patient presents with  . Depression  . Abrasion    pt states she has abrasions on both lower legs that are not healing, would like a tetanus shot    Lower leg abrasion Was in [place] and hit both lower legs on a chair.  These became infected and got Doxycycline 100 mg and after 1 round no benefit.    Depression Having issues with depression off and on for much of her life.  Not sure what she tried.  Asked for Wellbutrin 1 month ago without benefit.  States she is "a bit OCD" and needs to make herself focus.  Everything is driving her nuts.   [facility] 11/[facility]/2016 08/05/2016  Decreased Interest 2 1  Down, Depressed, Hopeless 3 1  PHQ - 2 Score 5 2  Altered sleeping 3 0  Tired, decreased energy 3 2  Change in appetite 3 2  Feeling bad or failure about yourself  3 1  Trouble concentrating 1 0  Moving slowly or fidgety/restless 0 0  Suicidal thoughts 0 0  PHQ-9 Score 18 7     Relevant past medical, surgical, family and social history reviewed and updated as indicated. Interim medical history since our last visit reviewed. Allergies and medications reviewed and updated.  Review of Systems  Constitutional: Positive for fatigue.  HENT: Negative.   Respiratory: Negative.   Cardiovascular: Negative.     Per HPI unless specifically indicated above     Objective:    BP 123/78   Pulse 69   Temp 98.3 F (36.8 C) (Oral)   Wt 138 lb 12.8 oz (63 kg)   SpO2 99%   BMI 23.24 kg/m   Wt Readings from Last 3 Encounters:  11/29/16 138 lb 12.8 oz (63 kg)  08/05/16 137 lb 1.6 oz (62.2 kg)  06/18/16 137 lb (62.1 kg)    Physical Exam  Constitutional: She is oriented to person, place, and time. She  appears well-developed and well-nourished. No distress.  HENT:  Head: Normocephalic and atraumatic.  Eyes: Conjunctivae and lids are normal. Right eye exhibits no discharge. Left eye exhibits no discharge. No scleral icterus.  Cardiovascular: Normal rate.  Pulmonary/Chest: Effort normal.  Abdominal: Normal appearance. There is no splenomegaly or hepatomegaly.  Musculoskeletal: Normal range of motion.  Neurological: She is alert and oriented to person, place, and time.  Skin: Skin is intact. No rash noted. No pallor.  Psychiatric: She has a normal mood and affect. Her behavior is normal. Judgment and thought content normal.     Reviewed former records.  Tried Citalopram with no effect.  Effexor may have helped more.  Will restart.      Assessment & Plan:   Problem List Items Addressed This Visit      Unprioritized   Depression, major, single episode, severe (HCC)    Start Effexor 37.5 mg for 2 weeks and then 75 mg after 2 weeks.        Relevant Medications   venlafaxine XR (EFFEXOR-XR) 37.5 MG 24 hr capsule    Other Visit Diagnoses  Abrasion of left lower extremity, initial encounter    -  Primary   Use complression plus occlusive dressing.  Continue with Doxycycline.  RTC if no improvement   Relevant Orders   Td vaccine greater than or equal to 7yo preservative free IM (Completed)       Follow up plan: Return in about 4 weeks (around 12/27/2016).

## 2016-11-29 NOTE — Assessment & Plan Note (Signed)
Start Effexor 37.5 mg for 2 weeks and then 75 mg after 2 weeks.

## 2016-12-01 ENCOUNTER — Other Ambulatory Visit: Payer: Self-pay | Admitting: Unknown Physician Specialty

## 2016-12-03 ENCOUNTER — Ambulatory Visit
Admission: RE | Admit: 2016-12-03 | Discharge: 2016-12-03 | Disposition: A | Discharge status: Home / Self Care | Payer: BLUE CROSS/BLUE SHIELD | Source: Ambulatory Visit | Attending: Unknown Physician Specialty | Admitting: Unknown Physician Specialty

## 2016-12-03 DIAGNOSIS — Z1231 Encounter for screening mammogram for malignant neoplasm of breast: Secondary | ICD-10-CM | POA: Insufficient documentation

## 2016-12-03 DIAGNOSIS — Z0000 Encounter for general adult medical examination without abnormal findings: Secondary | ICD-10-CM

## 2016-12-24 ENCOUNTER — Ambulatory Visit: Payer: BLUE CROSS/BLUE SHIELD | Admitting: Unknown Physician Specialty

## 2016-12-24 ENCOUNTER — Encounter: Payer: Self-pay | Admitting: Unknown Physician Specialty

## 2016-12-24 ENCOUNTER — Telehealth: Payer: Self-pay | Admitting: Unknown Physician Specialty

## 2016-12-24 VITALS — BP 118/76 | HR 85 | Temp 99.1°F | Wt 139.4 lb

## 2016-12-24 DIAGNOSIS — F322 Major depressive disorder, single episode, severe without psychotic features: Secondary | ICD-10-CM | POA: Diagnosis not present

## 2016-12-24 DIAGNOSIS — T148XXA Other injury of unspecified body region, initial encounter: Secondary | ICD-10-CM

## 2016-12-24 MED ORDER — VENLAFAXINE HCL ER 37.5 MG PO CP24: 75.0000 mg | ORAL_CAPSULE | Freq: Every day | ORAL | 1 refills | Status: DC

## 2016-12-24 NOTE — Telephone Encounter (Signed)
Routing to provider, patient seen this morning. Pharmacy requesting prescription be changed to #180 for a 90 day supply.

## 2016-12-24 NOTE — Telephone Encounter (Signed)
[facility] faxed refill request on venlafaxine HCL ER 37.5 mg #180   Thanks

## 2016-12-24 NOTE — Assessment & Plan Note (Signed)
50% improvement.  Hopefully will continue to improve.  Recheck 6 weeks

## 2016-12-24 NOTE — Progress Notes (Addendum)
BP 118/76   Pulse 85   Temp 99.1 F (37.3 C) (Oral)   Wt 139 lb 6.4 oz (63.2 kg)   SpO2 95%   BMI 23.34 kg/m    Subjective:    Patient ID: Shelly Tyler, female    DOB: [birth year], 51 y.o.   MRN: [id number]  HPI: Shelly Tyler is a 51 y.o. female  Chief Complaint  Patient presents with  . Depression    4 week f/up   Depression Pt states she is "better."  States she just started her Effexor 75 mg.  States her anxiety is improved and able to chill out.  Everything is not driving her "nuts" anymore.   [facility] 12/24/2016 11/29/2016 08/05/2016  Decreased Interest 1 2 1   Down, Depressed, Hopeless 1 3 1   PHQ - 2 Score 2 5 2   Altered sleeping 2 3 0  Tired, decreased energy 2 3 2   Change in appetite 1 3 2   Feeling bad or failure about yourself  1 3 1   Trouble concentrating 1 1 0  Moving slowly or fidgety/restless 0 0 0  Suicidal thoughts 0 0 0  PHQ-9 Score 9 18 7    Abrasion Improved with compression and antibiotics prescribed last visit.    Relevant past medical, surgical, family and social history reviewed and updated as indicated. Interim medical history since our last visit reviewed. Allergies and medications reviewed and updated.  Review of Systems  Constitutional: Positive for fatigue.  HENT: Negative.   Respiratory: Negative.   Cardiovascular: Negative.   Gastrointestinal: Negative.     Per HPI unless specifically indicated above     Objective:    BP 118/76   Pulse 85   Temp 99.1 F (37.3 C) (Oral)   Wt 139 lb 6.4 oz (63.2 kg)   SpO2 95%   BMI 23.34 kg/m   Wt Readings from Last 3 Encounters:  12/24/16 139 lb 6.4 oz (63.2 kg)  11/29/16 138 lb 12.8 oz (63 kg)  08/05/16 137 lb 1.6 oz (62.2 kg)    Physical Exam  Constitutional: She is oriented to person, place, and time. She appears well-developed and well-nourished. No distress.  HENT:  Head: Normocephalic and atraumatic.  Eyes: Conjunctivae and lids are normal. Right eye  exhibits no discharge. Left eye exhibits no discharge. No scleral icterus.  Neck: Normal range of motion. Neck supple. No JVD present. Carotid bruit is not present.  Cardiovascular: Normal rate, regular rhythm and normal heart sounds.  Pulmonary/Chest: Effort normal and breath sounds normal.  Abdominal: Normal appearance. There is no splenomegaly or hepatomegaly.  Musculoskeletal: Normal range of motion.  Neurological: She is alert and oriented to person, place, and time.  Skin: Skin is warm, dry and intact. No rash noted. No pallor.  Psychiatric: She has a normal mood and affect. Her behavior is normal. Judgment and thought content normal.   Results for orders placed or performed during the hospital encounter of 08/31/16  Basic metabolic panel  Result Value Ref Range   Sodium 141 135 - 145 mmol/L   Potassium 4.0 3.5 - 5.1 mmol/L   Chloride 108 101 - 111 mmol/L   CO2 26 22 - 32 mmol/L   Glucose, Bld 85 65 - 99 mg/dL   BUN 15 6 - 20 mg/dL   Creatinine, Ser 4.090.74 0.44 - 1.00 mg/dL   Calcium 81.110.2 8.9 - 91.410.3 mg/dL   GFR calc non Af Amer >60 >60 mL/min   GFR calc Af  Amer >60 >60 mL/min   Anion gap 7 5 - 15  CBC  Result Value Ref Range   WBC 10.1 3.6 - 11.0 K/uL   RBC 3.98 3.80 - 5.20 MIL/uL   Hemoglobin 12.9 12.0 - 16.0 g/dL   HCT 16.137.0 09.635.0 - 04.547.0 %   MCV 92.9 80.0 - 100.0 fL   MCH 32.3 26.0 - 34.0 pg   MCHC 34.8 32.0 - 36.0 g/dL   RDW 40.913.4 81.111.5 - 91.414.5 %   Platelets 367 150 - 440 K/uL  Troponin I  Result Value Ref Range   Troponin I <0.03 <0.03 ng/mL      Assessment & Plan:   Problem List Items Addressed This Visit      Unprioritized   Depression, major, single episode, severe (HCC)    50% improvement.  Hopefully will continue to improve.  Recheck 6 weeks       Other Visit Diagnoses    Abrasion    -  Primary   left lower leg resolved       Follow up plan: Return in about 6 weeks (around 02/04/2017).

## 2017-01-06 ENCOUNTER — Ambulatory Visit: Payer: BLUE CROSS/BLUE SHIELD | Admitting: Unknown Physician Specialty

## 2017-02-04 ENCOUNTER — Encounter: Payer: Self-pay | Admitting: Unknown Physician Specialty

## 2017-02-04 ENCOUNTER — Ambulatory Visit: Payer: BLUE CROSS/BLUE SHIELD | Admitting: Unknown Physician Specialty

## 2017-02-04 VITALS — BP 122/74 | HR 78 | Temp 98.6°F | Wt 138.2 lb

## 2017-02-04 DIAGNOSIS — Z720 Tobacco use: Secondary | ICD-10-CM

## 2017-02-04 DIAGNOSIS — Z1211 Encounter for screening for malignant neoplasm of colon: Secondary | ICD-10-CM | POA: Diagnosis not present

## 2017-02-04 DIAGNOSIS — F325 Major depressive disorder, single episode, in full remission: Secondary | ICD-10-CM

## 2017-02-04 MED ORDER — VENLAFAXINE HCL ER 75 MG PO CP24: 75.0000 mg | ORAL_CAPSULE | Freq: Every day | ORAL | 1 refills | Status: DC

## 2017-02-04 NOTE — Progress Notes (Signed)
BP 122/74   Pulse 78   Temp 98.6 F (37 C) (Oral)   Wt 138 lb 3.2 oz (62.7 kg)   SpO2 98%   BMI 23.14 kg/m    Subjective:    Patient ID: Shelly Tyler, female    DOB: 1965/03/24, 52 y.o.   MRN: [id number]  HPI: Shelly Tyler is a 52 y.o. female  Chief Complaint  Patient presents with  . Depression    6 week f/up   Depression In general less stressed and more relaxed.   [facility] 02/04/2017 12/24/2016 11/29/2016 08/05/2016  Decreased Interest 1 1 2 1   Down, Depressed, Hopeless 0 1 3 1   PHQ - 2 Score 1 2 5 2   Altered sleeping 0 2 3 0  Tired, decreased energy 1 2 3 2   Change in appetite 2 1 3 2   Feeling bad or failure about yourself  0 1 3 1   Trouble concentrating 0 1 1 0  Moving slowly or fidgety/restless 0 0 0 0  Suicidal thoughts 0 0 0 0  PHQ-9 Score 4 9 18 7    Does feel like she has a bigger appetitie  Tobacco Smoking 1/2 ppd.  Working on cutting back  Relevant past medical, surgical, family and social history reviewed and updated as indicated. Interim medical history since our last visit reviewed. Allergies and medications reviewed and updated.  Review of Systems  Constitutional: Negative.   HENT: Negative.   Eyes: Negative.   Respiratory: Negative.   Cardiovascular: Negative.   Gastrointestinal: Negative.   Endocrine: Negative.   Genitourinary: Negative.   Musculoskeletal: Negative.   Skin: Negative.   Allergic/Immunologic: Negative.   Neurological: Negative.   Hematological: Negative.   Psychiatric/Behavioral: Negative.     Per HPI unless specifically indicated above     Objective:    BP 122/74   Pulse 78   Temp 98.6 F (37 C) (Oral)   Wt 138 lb 3.2 oz (62.7 kg)   SpO2 98%   BMI 23.14 kg/m   Wt Readings from Last 3 Encounters:  02/04/17 138 lb 3.2 oz (62.7 kg)  12/24/16 139 lb 6.4 oz (63.2 kg)  11/29/16 138 lb 12.8 oz (63 kg)    Physical Exam  Constitutional: She is oriented to person, place, and time. She appears  well-developed and well-nourished. No distress.  HENT:  Head: Normocephalic and atraumatic.  Eyes: Conjunctivae and lids are normal. Right eye exhibits no discharge. Left eye exhibits no discharge. No scleral icterus.  Neck: Normal range of motion. Neck supple. No JVD present. Carotid bruit is not present.  Cardiovascular: Normal rate, regular rhythm and normal heart sounds.  Pulmonary/Chest: Effort normal and breath sounds normal.  Abdominal: Normal appearance. There is no splenomegaly or hepatomegaly.  Musculoskeletal: Normal range of motion.  Neurological: She is alert and oriented to person, place, and time.  Skin: Skin is warm, dry and intact. No rash noted. No pallor.  Psychiatric: She has a normal mood and affect. Her behavior is normal. Judgment and thought content normal.      Assessment & Plan:   Problem List Items Addressed This Visit      Unprioritized   Depression, major, single episode, complete remission (HCC)    Remission on current therapy.  Will continue with 75 mg Effexor.       Relevant Medications   venlafaxine XR (EFFEXOR-XR) 75 MG 24 hr capsule   Tobacco abuse    Cutting back on smoking by using  a vape       Other Visit Diagnoses    Screening for colon cancer    -  Primary   Relevant Orders   Ambulatory referral to Gastroenterology      HM: Schedule colonoscopy  Follow up plan: Return in about 6 months (around 08/04/2017).

## 2017-02-04 NOTE — Assessment & Plan Note (Signed)
Remission on current therapy.  Will continue with 75 mg Effexor.

## 2017-02-04 NOTE — Assessment & Plan Note (Signed)
Cutting back on smoking by using a vape

## 2017-02-07 ENCOUNTER — Other Ambulatory Visit: Payer: Self-pay

## 2017-02-07 DIAGNOSIS — Z1211 Encounter for screening for malignant neoplasm of colon: Secondary | ICD-10-CM

## 2017-02-07 MED ORDER — NA SULFATE-K SULFATE-MG SULF 17.5-3.13-1.6 GM/177ML PO SOLN: 1.0000 | Freq: Once | ORAL | 0 refills | Status: AC

## 2017-03-10 ENCOUNTER — Ambulatory Visit: Payer: BLUE CROSS/BLUE SHIELD | Admitting: Certified Registered Nurse Anesthetist

## 2017-03-10 ENCOUNTER — Encounter: Payer: Self-pay | Admitting: *Deleted

## 2017-03-10 ENCOUNTER — Encounter
Admission: RE | Disposition: A | Discharge status: Home / Self Care | Payer: Self-pay | Source: Ambulatory Visit | Attending: Gastroenterology

## 2017-03-10 ENCOUNTER — Ambulatory Visit
Admission: RE | Admit: 2017-03-10 | Discharge: 2017-03-10 | Disposition: A | Discharge status: Home / Self Care | Payer: BLUE CROSS/BLUE SHIELD | Source: Ambulatory Visit | Attending: Gastroenterology | Admitting: Gastroenterology

## 2017-03-10 ENCOUNTER — Other Ambulatory Visit: Payer: Self-pay

## 2017-03-10 DIAGNOSIS — F17210 Nicotine dependence, cigarettes, uncomplicated: Secondary | ICD-10-CM | POA: Diagnosis not present

## 2017-03-10 DIAGNOSIS — F419 Anxiety disorder, unspecified: Secondary | ICD-10-CM | POA: Diagnosis not present

## 2017-03-10 DIAGNOSIS — F429 Obsessive-compulsive disorder, unspecified: Secondary | ICD-10-CM | POA: Insufficient documentation

## 2017-03-10 DIAGNOSIS — Z79899 Other long term (current) drug therapy: Secondary | ICD-10-CM | POA: Insufficient documentation

## 2017-03-10 DIAGNOSIS — F329 Major depressive disorder, single episode, unspecified: Secondary | ICD-10-CM | POA: Insufficient documentation

## 2017-03-10 DIAGNOSIS — Z1211 Encounter for screening for malignant neoplasm of colon: Secondary | ICD-10-CM

## 2017-03-10 DIAGNOSIS — Z86010 Personal history of colonic polyps: Secondary | ICD-10-CM | POA: Insufficient documentation

## 2017-03-10 HISTORY — PX: COLONOSCOPY WITH PROPOFOL: SHX5780

## 2017-03-10 SURGERY — COLONOSCOPY WITH PROPOFOL
Anesthesia: General

## 2017-03-10 MED ORDER — LIDOCAINE HCL (CARDIAC) 20 MG/ML IV SOLN
INTRAVENOUS | Status: DC | PRN
  Administered 2017-03-10: 50 mg via INTRAVENOUS

## 2017-03-10 MED ORDER — PROPOFOL 500 MG/50ML IV EMUL
INTRAVENOUS | Status: AC
  Filled 2017-03-10: qty 50

## 2017-03-10 MED ORDER — LIDOCAINE HCL (PF) 2 % IJ SOLN
INTRAMUSCULAR | Status: AC
  Filled 2017-03-10: qty 10

## 2017-03-10 MED ORDER — SODIUM CHLORIDE 0.9 % IV SOLN
INTRAVENOUS | Status: DC
  Administered 2017-03-10: 09:00:00 via INTRAVENOUS

## 2017-03-10 MED ORDER — PROPOFOL 500 MG/50ML IV EMUL
INTRAVENOUS | Status: DC | PRN
  Administered 2017-03-10: 175 ug/kg/min via INTRAVENOUS

## 2017-03-10 MED ORDER — PROPOFOL 10 MG/ML IV BOLUS
INTRAVENOUS | Status: DC | PRN
  Administered 2017-03-10: 50 mg via INTRAVENOUS
  Administered 2017-03-10: 30 mg via INTRAVENOUS

## 2017-03-10 NOTE — Anesthesia Procedure Notes (Signed)
Date/Time: 03/10/2017 9:31 AM Performed by: Ginger CarneMichelet, Alichia Alridge, CRNA Pre-anesthesia Checklist: Patient identified, Emergency Drugs available, Suction available, Patient being monitored and Timeout performed Patient Re-evaluated:Patient Re-evaluated prior to induction Oxygen Delivery Method: Nasal cannula

## 2017-03-10 NOTE — Anesthesia Preprocedure Evaluation (Signed)
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:   Pulmonary neg shortness of breath, neg COPD, neg recent URI, Current Smoker,           Cardiovascular Exercise Tolerance: Good negative cardio ROS       Neuro/Psych PSYCHIATRIC DISORDERS Anxiety Depression negative neurological ROS     GI/Hepatic negative GI ROS, Neg liver ROS,   Endo/Other  negative endocrine ROS  Renal/GU negative Renal ROS  negative genitourinary   Musculoskeletal   Abdominal   Peds  Hematology negative hematology ROS (+)   Anesthesia Other Findings Past Medical History: No date: Anxiety No date: Depression No date: Migraine No date: OCD (obsessive compulsive disorder) No date: Tobacco abuse   Reproductive/Obstetrics negative OB ROS                             Anesthesia Physical Anesthesia Plan  ASA: II  Anesthesia Plan: General   Post-op Pain Management:    Induction: Intravenous  PONV Risk Score and Plan: 2 and Propofol infusion  Airway Management Planned: Nasal Cannula  Additional Equipment:   Intra-op Plan:   Post-operative Plan:   Informed Consent: I have reviewed the patients History and Physical, chart, labs and discussed the procedure including the risks, benefits and alternatives for the proposed anesthesia with the patient or authorized representative who has indicated his/her understanding and acceptance.   Dental Advisory Given  Plan Discussed with: Anesthesiologist, CRNA and  Surgeon  Anesthesia Plan Comments:         Anesthesia Quick Evaluation

## 2017-03-10 NOTE — Transfer of Care (Signed)
Immediate Anesthesia Transfer of Care Note  Patient: Shelly Tyler  Procedure(s) Performed: COLONOSCOPY WITH PROPOFOL (N/A )  Patient Location: PACU  Anesthesia Type:General  Level of Consciousness: awake, alert  and oriented  Airway & Oxygen Therapy: Patient Spontanous Breathing and Patient connected to nasal cannula oxygen  Post-op Assessment: Report given to RN and Post -op Vital signs reviewed and stable  Post vital signs: Reviewed and stable  Last Vitals:  Vitals:   03/10/17 0834 03/10/17 0950  BP: 109/67 109/70  Pulse: 73 74  Resp: 16 16  Temp: (!) 35.8 C (!) 36.1 C  SpO2: 98% 99%    Last Pain:  Vitals:   03/10/17 0950  TempSrc: Tympanic         Complications: No apparent anesthesia complications

## 2017-03-10 NOTE — Anesthesia Postprocedure Evaluation (Signed)
Anesthesia Post Note  Patient: Shelly Tyler  Procedure(s) Performed: COLONOSCOPY WITH PROPOFOL (N/A )  Patient location during evaluation: Endoscopy Anesthesia Type: General Level of consciousness: awake and alert Pain management: pain level controlled Vital Signs Assessment: post-procedure vital signs reviewed and stable Respiratory status: spontaneous breathing, nonlabored ventilation, respiratory function stable and patient connected to nasal cannula oxygen Cardiovascular status: blood pressure returned to baseline and stable Postop Assessment: no apparent nausea or vomiting Anesthetic complications: no     Last Vitals:  Vitals:   03/10/17 1000 03/10/17 1010  BP: 111/79 120/85  Pulse: 71   Resp: 16 18  Temp:    SpO2: 99% 99%    Last Pain:  Vitals:   03/10/17 0950  TempSrc: Tympanic                 Lenard SimmerAndrew Leianna Barga

## 2017-03-10 NOTE — Anesthesia Post-op Follow-up Note (Signed)
Anesthesia QCDR form completed.        

## 2017-03-10 NOTE — H&P (Signed)
Wyline MoodKiran Avia Merkley, MD [address], [address], [place], [place], [id number] [address], [address], [place], [place], [address] Phone: [phone]  Fax: [phone]  Primary Care Physician:  Gabriel CirriWicker, Cheryl, NP   Pre-Procedure History & Physical: HPI:  Shelly AgentMarjorie L Tyler is a 52 y.o. female is here for an colonoscopy.   Past Medical History:  Diagnosis Date  . Anxiety   . Depression   . Migraine   . OCD (obsessive compulsive disorder)   . Tobacco abuse     Past Surgical History:  Procedure Laterality Date  . BREAST BIOPSY Right 2011   neg  . BUNIONECTOMY    . CESAREAN SECTION    . WISDOM TOOTH EXTRACTION      Prior to Admission medications   Medication Sig Start Date End Date Taking? Authorizing Provider  acetaminophen (TYLENOL) 325 MG tablet Take by mouth.   Yes [provider]  venlafaxine XR (EFFEXOR-XR) 75 MG 24 hr capsule Take 1 capsule (75 mg total) by mouth daily with breakfast. 02/04/17  Yes Gabriel CirriWicker, Cheryl, NP    Allergies as of 02/07/2017  . (No Known Allergies)    Family History  Problem Relation Age of Onset  . Diabetes type II Mother   . Cancer Mother        lung  . Diabetes Mother   . Hypertension Mother   . Osteoporosis Mother   . Alcohol abuse Father   . Cancer Paternal Grandmother        lung  . Alzheimer's disease Maternal Grandmother     Social History   Socioeconomic History  . Marital status: Married    Spouse name: Not on file  . Number of children: Not on file  . Years of education: Not on file  . Highest education level: Not on file  Social Needs  . Financial resource strain: Not on file  . Food insecurity - worry: Not on file  . Food insecurity - inability: Not on file  . Transportation needs - medical: Not on file  . Transportation needs - non-medical: Not on file  Occupational History  . Not on file  Tobacco Use  . Smoking status: Current Every Day Smoker    Packs/day: 0.50    Types: Cigarettes  . Smokeless tobacco:  Never Used  Substance and Sexual Activity  . Alcohol use: Yes    Alcohol/week: 1.8 oz    Types: 3 Glasses of wine per week    Comment: occasional wine  . Drug use: No  . Sexual activity: Yes  Other Topics Concern  . Not on file  Social History Narrative  . Not on file    Review of Systems: See HPI, otherwise negative ROS  Physical Exam: BP 109/67   Pulse 73   Temp (!) 96.4 F (35.8 C) (Tympanic)   Resp 16   Ht 5\' 4"  (1.626 m)   Wt 128 lb (58.1 kg)   SpO2 98%   BMI 21.97 kg/m  General:   Alert,  pleasant and cooperative in NAD Head:  Normocephalic and atraumatic. Neck:  Supple; no masses or thyromegaly. Lungs:  Clear throughout to auscultation, normal respiratory effort.    Heart:  +S1, +S2, Regular rate and rhythm, No edema. Abdomen:  Soft, nontender and nondistended. Normal bowel sounds, without guarding, and without rebound.   Neurologic:  Alert and  oriented x4;  grossly normal neurologically.  Impression/Plan: Shelly Tyler is here for an colonoscopy to be performed for surveillance due to prior  history of colon polyps   Risks, benefits, limitations, and alternatives regarding  colonoscopy have been reviewed with the patient.  Questions have been answered.  All parties agreeable.   [name], MD  03/10/2017, 9:24 AM

## 2017-03-10 NOTE — Op Note (Signed)
[facility] Gastroenterology Patient Name: Shelly RoamMarjorie Tyler Procedure Date: 03/10/2017 9:22 AM MRN: [id number] Account #: [id number] Date of Birth: 02/03/1965 Admit Type: Outpatient Age: [age] Room: [facility] ENDO ROOM 4 Gender: Female Note Status: Finalized Procedure:            Colonoscopy Indications:          High risk colon cancer surveillance: Personal history                        of colonic polyps Providers:            Wyline MoodKiran Abygail Galeno MD, MD Referring MD:         Steele SizerMark A. Crissman, MD (Referring MD) Medicines:            Monitored Anesthesia Care Complications:        No immediate complications. Procedure:            Pre-Anesthesia Assessment:                       - Prior to the procedure, a History and Physical was                        performed, and patient medications, allergies and                        sensitivities were reviewed. The patient's tolerance of                        previous anesthesia was reviewed.                       - The risks and benefits of the procedure and the                        sedation options and risks were discussed with the                        patient. All questions were answered and informed                        consent was obtained.                       - ASA Grade Assessment: II - A patient with mild                        systemic disease.                       After obtaining informed consent, the colonoscope was                        passed under direct vision. Throughout the procedure,                        the patient's blood pressure, pulse, and oxygen                        saturations were monitored continuously. The  Colonoscope was introduced through the anus and                        advanced to the the cecum, identified by the                        appendiceal orifice, IC valve and transillumination.                        The colonoscopy was performed with ease. The patient                 tolerated the procedure well. The quality of the bowel                        preparation was good. Findings:      The perianal and digital rectal examinations were normal.      The entire examined colon appeared normal on direct and retroflexion       views. Impression:           - The entire examined colon is normal on direct and                        retroflexion views.                       - No specimens collected. Recommendation:       - Discharge patient to home (with escort).                       - Resume previous diet.                       - Continue present medications.                       - Repeat colonoscopy in 5 years for surveillance. Procedure Code(s):    --- Professional ---                       [phone], Colonoscopy, flexible; diagnostic, including                        collection of specimen(s) by brushing or washing, when                        performed (separate procedure) Diagnosis Code(s):    --- Professional ---                       Z86.010, Personal history of colonic polyps CPT copyright 2016 American Medical Association. All rights reserved. The codes documented in this report are preliminary and upon coder review may  be revised to meet current compliance requirements. [name], MD [name] MD, MD 03/10/2017 9:53:57 AM This report has been signed electronically. Number of Addenda: 0 Note Initiated On: 03/10/2017 9:22 AM Scope Withdrawal Time: 0 hours 11 minutes 28 seconds  Total Procedure Duration: 0 hours 17 minutes 49 seconds       [facility]

## 2017-03-11 ENCOUNTER — Encounter: Payer: Self-pay | Admitting: Gastroenterology

## 2017-08-04 ENCOUNTER — Ambulatory Visit: Payer: BLUE CROSS/BLUE SHIELD | Admitting: Unknown Physician Specialty

## 2017-08-04 ENCOUNTER — Other Ambulatory Visit: Payer: Self-pay | Admitting: Unknown Physician Specialty

## 2017-08-05 NOTE — Telephone Encounter (Signed)
Effexor-XR 75 mg 24 hr capsule refill request  LOV 02/04/17 with Gabriel Cirriheryl Wicker.    Had appt in July that was cancelled by pt.   No future appts noted for F/U.  [facility] [address] [facility] [place], [place]

## 2017-08-17 ENCOUNTER — Emergency Department
Admission: EM | Admit: 2017-08-17 | Discharge: 2017-08-17 | Discharge status: Against Medical Advice | Payer: BLUE CROSS/BLUE SHIELD

## 2017-11-06 ENCOUNTER — Ambulatory Visit: Payer: BLUE CROSS/BLUE SHIELD | Admitting: Family Medicine

## 2018-05-04 ENCOUNTER — Encounter: Payer: Self-pay | Admitting: Unknown Physician Specialty

## 2018-05-13 ENCOUNTER — Other Ambulatory Visit: Payer: Self-pay | Admitting: Nurse Practitioner

## 2018-05-13 ENCOUNTER — Encounter: Payer: Self-pay | Admitting: Unknown Physician Specialty

## 2018-05-13 DIAGNOSIS — Z20822 Contact with and (suspected) exposure to covid-19: Secondary | ICD-10-CM

## 2018-05-13 DIAGNOSIS — Z20828 Contact with and (suspected) exposure to other viral communicable diseases: Secondary | ICD-10-CM

## 2018-05-13 NOTE — Progress Notes (Signed)
Patient works for [employer] and is requesting COVID antibody screen which is now available.

## 2018-05-18 ENCOUNTER — Other Ambulatory Visit: Payer: BLUE CROSS/BLUE SHIELD

## 2018-06-20 ENCOUNTER — Encounter: Payer: Self-pay | Admitting: Unknown Physician Specialty

## 2018-06-22 ENCOUNTER — Ambulatory Visit (INDEPENDENT_AMBULATORY_CARE_PROVIDER_SITE_OTHER): Payer: BC Managed Care – PPO | Admitting: Family Medicine

## 2018-06-22 ENCOUNTER — Other Ambulatory Visit: Payer: Self-pay

## 2018-06-22 ENCOUNTER — Ambulatory Visit: Payer: Self-pay | Admitting: Family Medicine

## 2018-06-22 ENCOUNTER — Encounter: Payer: Self-pay | Admitting: Family Medicine

## 2018-06-22 VITALS — Ht 66.0 in | Wt 125.0 lb

## 2018-06-22 DIAGNOSIS — B354 Tinea corporis: Secondary | ICD-10-CM | POA: Diagnosis not present

## 2018-06-22 MED ORDER — CLOTRIMAZOLE-BETAMETHASONE 1-0.05 % EX CREA: 1.0000 "application " | TOPICAL_CREAM | Freq: Two times a day (BID) | CUTANEOUS | 1 refills | Status: DC

## 2018-06-22 NOTE — Progress Notes (Signed)
Ht 5\' 6"  (1.676 m)   Wt 125 lb (56.7 kg)   BMI 20.18 kg/m    Subjective:    Patient ID: Shelly Tyler, female    DOB: Mar 16, 1965, 53 y.o.   MRN: [id number]  HPI: Shelly Tyler is a 53 y.o. female  Chief Complaint  Patient presents with  . Rash    x about a week, bilateral legs. itchy a bit, per patient. tried OTC cream and apple cider vinager   RASH- does not remember tick bite Duration:  1 week  Location: bilateral legs  Itching: yes Burning: no Redness: yes Oozing: no Scaling: yes Blisters: no Painful: no Fevers: no Change in detergents/soaps/personal care products: no Recent illness: no Recent travel:no History of same: no Context: better Alleviating factors: Halog, apple cider vinegar Treatments attempted:Halog, apple cider vinegar Shortness of breath: no  Throat/tongue swelling: no Myalgias/arthralgias: no  Relevant past medical, surgical, family and social history reviewed and updated as indicated. Interim medical history since our last visit reviewed. Allergies and medications reviewed and updated.  Review of Systems  Constitutional: Negative.   Respiratory: Negative.   Cardiovascular: Negative.   Neurological: Negative.   Psychiatric/Behavioral: Negative.     Per HPI unless specifically indicated above     Objective:    Ht 5\' 6"  (1.676 m)   Wt 125 lb (56.7 kg)   BMI 20.18 kg/m   Wt Readings from Last 3 Encounters:  06/22/18 125 lb (56.7 kg)  03/10/17 128 lb (58.1 kg)  02/04/17 138 lb 3.2 oz (62.7 kg)    Physical Exam Vitals signs and nursing note reviewed.  Constitutional:      General: She is not in acute distress.    Appearance: Normal appearance. She is not ill-appearing, toxic-appearing or diaphoretic.  HENT:     Head: Normocephalic and atraumatic.     Right Ear: External ear normal.     Left Ear: External ear normal.     Nose: Nose normal.     Mouth/Throat:     Mouth: Mucous membranes are moist.     Pharynx: Oropharynx is  clear.  Eyes:     General: No scleral icterus.       Right eye: No discharge.        Left eye: No discharge.     Conjunctiva/sclera: Conjunctivae normal.     Pupils: Pupils are equal, round, and reactive to light.  Neck:     Musculoskeletal: Normal range of motion.  Pulmonary:     Effort: Pulmonary effort is normal. No respiratory distress.     Comments: Speaking in full sentences Musculoskeletal: Normal range of motion.  Skin:    Coloration: Skin is not jaundiced or pale.     Findings: No bruising, erythema, lesion or rash.     Comments: Erythematous patch with central clearing on R shin  Neurological:     Mental Status: She is alert and oriented to person, place, and time. Mental status is at baseline.  Psychiatric:        Mood and Affect: Mood normal.        Behavior: Behavior normal.        Thought Content: Thought content normal.        Judgment: Judgment normal.     Results for orders placed or performed during the hospital encounter of 41/66/06  Basic metabolic panel  Result Value Ref Range   Sodium 141 135 - 145 mmol/L   Potassium 4.0 3.5 - 5.1 mmol/L  Chloride 108 101 - 111 mmol/L   CO2 26 22 - 32 mmol/L   Glucose, Bld 85 65 - 99 mg/dL   BUN 15 6 - 20 mg/dL   Creatinine, Ser 1.610.74 0.44 - 1.00 mg/dL   Calcium 09.610.2 8.9 - 04.510.3 mg/dL   GFR calc non Af Amer >60 >60 mL/min   GFR calc Af Amer >60 >60 mL/min   Anion gap 7 5 - 15  CBC  Result Value Ref Range   WBC 10.1 3.6 - 11.0 K/uL   RBC 3.98 3.80 - 5.20 MIL/uL   Hemoglobin 12.9 12.0 - 16.0 g/dL   HCT 40.937.0 81.135.0 - 91.447.0 %   MCV 92.9 80.0 - 100.0 fL   MCH 32.3 26.0 - 34.0 pg   MCHC 34.8 32.0 - 36.0 g/dL   RDW 78.213.4 95.611.5 - 21.314.5 %   Platelets 367 150 - 440 K/uL  Troponin I  Result Value Ref Range   Troponin I <0.03 <0.03 ng/mL      Assessment & Plan:   Problem List Items Addressed This Visit    None    Visit Diagnoses    Tinea corporis    -  Primary   Will treat with lotrisone. Call if not getting better.  If starts feeling sick in early August- needs tick panel.    Relevant Medications   clotrimazole-betamethasone (LOTRISONE) cream       Follow up plan: Return for As able- physical (last in 2018).   . This visit was completed via FaceTime due to the restrictions of the COVID-19 pandemic. All issues as above were discussed and addressed. Physical exam was done as above through visual confirmation on FaceTime. If it was felt that the patient should be evaluated in the office, they were directed there. The patient verbally consented to this visit. . Location of the patient: home . Location of the provider: work . Those involved with this call:  . Provider: Olevia PerchesMegan Johnson, DO . CMA: Elton SinAnita Quito, CMA . Front Desk/Registration: Adela Portshristan Williamson  . Time spent on call: 15 minutes with patient face to face via video conference. More than 50% of this time was spent in counseling and coordination of care. 23 minutes total spent in review of patient's record and preparation of their chart.

## 2018-08-26 ENCOUNTER — Encounter: Payer: Self-pay | Admitting: Family Medicine

## 2019-08-27 ENCOUNTER — Encounter: Payer: Self-pay | Admitting: Nurse Practitioner

## 2019-09-03 ENCOUNTER — Encounter: Payer: Self-pay | Admitting: Nurse Practitioner

## 2019-09-03 ENCOUNTER — Other Ambulatory Visit: Payer: Self-pay

## 2019-09-03 ENCOUNTER — Ambulatory Visit (INDEPENDENT_AMBULATORY_CARE_PROVIDER_SITE_OTHER): Payer: BC Managed Care – PPO | Admitting: Nurse Practitioner

## 2019-09-03 VITALS — BP 115/75 | HR 65 | Temp 98.3°F | Wt 145.0 lb

## 2019-09-03 DIAGNOSIS — Z0000 Encounter for general adult medical examination without abnormal findings: Secondary | ICD-10-CM

## 2019-09-03 DIAGNOSIS — E559 Vitamin D deficiency, unspecified: Secondary | ICD-10-CM | POA: Diagnosis not present

## 2019-09-03 DIAGNOSIS — Z1329 Encounter for screening for other suspected endocrine disorder: Secondary | ICD-10-CM

## 2019-09-03 DIAGNOSIS — Z780 Asymptomatic menopausal state: Secondary | ICD-10-CM

## 2019-09-03 DIAGNOSIS — F17210 Nicotine dependence, cigarettes, uncomplicated: Secondary | ICD-10-CM | POA: Diagnosis not present

## 2019-09-03 DIAGNOSIS — Z13220 Encounter for screening for lipoid disorders: Secondary | ICD-10-CM

## 2019-09-03 DIAGNOSIS — Z1231 Encounter for screening mammogram for malignant neoplasm of breast: Secondary | ICD-10-CM

## 2019-09-03 LAB — UA/M W/RFLX CULTURE, ROUTINE
Bilirubin, UA: NEGATIVE
Glucose, UA: NEGATIVE
Ketones, UA: NEGATIVE
Leukocytes,UA: NEGATIVE
Nitrite, UA: NEGATIVE
Protein,UA: NEGATIVE
RBC, UA: NEGATIVE
Specific Gravity, UA: 1.015 (ref 1.005–1.030)
Urobilinogen, Ur: 0.2 mg/dL (ref 0.2–1.0)
pH, UA: 7 (ref 5.0–7.5)

## 2019-09-03 MED ORDER — BUPROPION HCL ER (SR) 150 MG PO TB12: ORAL_TABLET | ORAL | 3 refills | Status: DC

## 2019-09-03 NOTE — Assessment & Plan Note (Signed)
Long time smoker, since age 54.  Sent in Wellbutrin script today per patient request.  She wishes to work on cessation and has used this before.  Discussed annual lung screening and she is interested in this.  Placed referral to lung screening providers at [facility] and provided her with pamphlet on program.

## 2019-09-03 NOTE — Progress Notes (Signed)
BP 115/75   Pulse 65   Temp 98.3 F (36.8 C) (Oral)   Wt 145 lb (65.8 kg)   SpO2 97%   BMI 23.40 kg/m    Subjective:    Patient ID: Shelly Tyler, female    DOB: [birth year], 54 y.o.   MRN: [id number]  HPI: MAEBEL MARASCO is a 54 y.o. female presenting on 09/03/2019 for comprehensive medical examination. Current medical complaints include:none  She currently lives with: significant other Menopausal Symptoms: no   Presents for request for physical. Is a smoker, smokes 1 PPD, would like to quit and try Wellbutrin.  Has been a smoker since high school, has quit on and off through the years.  Smoking since age 13 -- 40 years of smoking.  Is interested in lung cancer screening.      Depression Screen done today and results listed below:  Depression screen [facility] 2/9 09/03/2019 02/04/2017 12/24/2016 11/29/2016 08/05/2016  Decreased Interest 3 1 1 2 1   Down, Depressed, Hopeless 3 0 1 3 1   PHQ - 2 Score 6 1 2 5 2   Altered sleeping 2 0 2 3 0  Tired, decreased energy 3 1 2 3 2   Change in appetite 3 2 1 3 2   Feeling bad or failure about yourself  1 0 1 3 1   Trouble concentrating 2 0 1 1 0  Moving slowly or fidgety/restless 0 0 0 0 0  Suicidal thoughts 0 0 0 0 0  PHQ-9 Score 17 4 9 18 7     The patient does not have a history of falls. I did not complete a risk assessment for falls. A plan of care for falls was not documented.   Past Medical History:  Past Medical History:  Diagnosis Date  . Anxiety   . Depression   . Migraine   . OCD (obsessive compulsive disorder)   . Tobacco abuse     Surgical History:  Past Surgical History:  Procedure Laterality Date  . BREAST BIOPSY Right 2011   neg  . BUNIONECTOMY    . CESAREAN SECTION    . COLONOSCOPY WITH PROPOFOL N/A 03/10/2017   Procedure: COLONOSCOPY WITH PROPOFOL;  Surgeon: , MD;  Location: [facility];  Service: Gastroenterology;  Laterality: N/A;  . WISDOM TOOTH EXTRACTION      Medications:  Current Outpatient  Medications on File Prior to Visit  Medication Sig  . acetaminophen (TYLENOL) 325 MG tablet Take by mouth.  . Ascorbic Acid (VITAMIN C PO) Take by mouth daily.  BIOTIN PO Take by mouth daily.  . Multiple Vitamins-Minerals (ZINC PO) Take by mouth daily.  . Pediatric Multiple Vit-Vit C (VITAMIN DAILY PO) Take by mouth daily.   No current facility-administered medications on file prior to visit.    Allergies:  No Known Allergies  Social History:  Social History   Socioeconomic History  . Marital status: Married    Spouse name: Not on file  . Number of children: Not on file  . Years of education: Not on file  . Highest education level: Not on file  Occupational History  . Not on file  Tobacco Use  . Smoking status: Current Every Day Smoker    Packs/day: 0.50    Types: Cigarettes  . Smokeless tobacco: Never Used  Vaping Use  . Vaping Use: Some days  Substance and Sexual Activity  . Alcohol use: Yes    Alcohol/week: 3.0 standard drinks    Types: 3 Glasses of wine  per week    Comment: occasional wine  . Drug use: No  . Sexual activity: Yes  Other Topics Concern  . Not on file  Social History Narrative  . Not on file   Social Determinants of Health   Financial Resource Strain:   . Difficulty of Paying Living Expenses: Not on file  Food Insecurity:   . Worried About Programme researcher, broadcasting/film/video in the Last Year: Not on file  . Ran Out of Food in the Last Year: Not on file  Transportation Needs:   . Lack of Transportation (Medical): Not on file  . Lack of Transportation (Non-Medical): Not on file  Physical Activity:   . Days of Exercise per Week: Not on file  . Minutes of Exercise per Session: Not on file  Stress:   . Feeling of Stress : Not on file  Social Connections:   . Frequency of Communication with Friends and Family: Not on file  . Frequency of Social Gatherings with Friends and Family: Not on file  . Attends Religious Services: Not on file  . Active Member of  Clubs or Organizations: Not on file  . Attends Banker Meetings: Not on file  . Marital Status: Not on file  Intimate Partner Violence:   . Fear of Current or Ex-Partner: Not on file  . Emotionally Abused: Not on file  . Physically Abused: Not on file  . Sexually Abused: Not on file   Social History   Tobacco Use  Smoking Status Current Every Day Smoker  . Packs/day: 0.50  . Types: Cigarettes  Smokeless Tobacco Never Used   Social History   Substance and Sexual Activity  Alcohol Use Yes  . Alcohol/week: 3.0 standard drinks  . Types: 3 Glasses of wine per week   Comment: occasional wine    Family History:  Family History  Problem Relation Age of Onset  . Diabetes type II Mother   . Cancer Mother        lung  . Diabetes Mother   . Hypertension Mother   . Osteoporosis Mother   . Alcohol abuse Father   . Cancer Paternal Grandmother        lung  . Alzheimer's disease Maternal Grandmother     Past medical history, surgical history, medications, allergies, family history and social history reviewed with patient today and changes made to appropriate areas of the chart.   Review of Systems - negative All other ROS negative except what is listed above and in the HPI.      Objective:    BP 115/75   Pulse 65   Temp 98.3 F (36.8 C) (Oral)   Wt 145 lb (65.8 kg)   SpO2 97%   BMI 23.40 kg/m   Wt Readings from Last 3 Encounters:  09/03/19 145 lb (65.8 kg)  06/22/18 125 lb (56.7 kg)  03/10/17 128 lb (58.1 kg)    Physical Exam Constitutional:      General: She is awake. She is not in acute distress.    Appearance: She is well-developed. She is not ill-appearing.  HENT:     Head: Normocephalic and atraumatic.     Right Ear: Hearing, tympanic membrane, ear canal and external ear normal. No drainage.     Left Ear: Hearing, tympanic membrane, ear canal and external ear normal. No drainage.     Nose: Nose normal.     Right Sinus: No maxillary sinus  tenderness or frontal sinus tenderness.  Left Sinus: No maxillary sinus tenderness or frontal sinus tenderness.     Mouth/Throat:     Mouth: Mucous membranes are moist.     Pharynx: Oropharynx is clear. Uvula midline. No pharyngeal swelling, oropharyngeal exudate or posterior oropharyngeal erythema.  Eyes:     General: Lids are normal.        Right eye: No discharge.        Left eye: No discharge.     Extraocular Movements: Extraocular movements intact.     Conjunctiva/sclera: Conjunctivae normal.     Pupils: Pupils are equal, round, and reactive to light.     Visual Fields: Right eye visual fields normal and left eye visual fields normal.  Neck:     Thyroid: No thyromegaly.     Vascular: No carotid bruit.     Trachea: Trachea normal.  Cardiovascular:     Rate and Rhythm: Normal rate and regular rhythm.     Heart sounds: Normal heart sounds. No murmur heard.  No gallop.   Pulmonary:     Effort: Pulmonary effort is normal. No accessory muscle usage or respiratory distress.     Breath sounds: Normal breath sounds.  Chest:     Breasts:        Right: Normal.        Left: Normal.     Comments: Dense breast tissue, no masses palpated. Abdominal:     General: Bowel sounds are normal.     Palpations: Abdomen is soft. There is no hepatomegaly or splenomegaly.     Tenderness: There is no abdominal tenderness.  Musculoskeletal:        General: Normal range of motion.     Cervical back: Normal range of motion and neck supple.     Right lower leg: No edema.     Left lower leg: No edema.  Lymphadenopathy:     Head:     Right side of head: No submental, submandibular, tonsillar, preauricular or posterior auricular adenopathy.     Left side of head: No submental, submandibular, tonsillar, preauricular or posterior auricular adenopathy.     Cervical: No cervical adenopathy.     Upper Body:     Right upper body: No supraclavicular, axillary or pectoral adenopathy.     Left upper body:  No supraclavicular, axillary or pectoral adenopathy.  Skin:    General: Skin is warm and dry.     Capillary Refill: Capillary refill takes less than 2 seconds.     Findings: No rash.  Neurological:     Mental Status: She is alert and oriented to person, place, and time.     Cranial Nerves: Cranial nerves are intact.     Gait: Gait is intact.     Deep Tendon Reflexes: Reflexes are normal and symmetric.     Reflex Scores:      Brachioradialis reflexes are 2+ on the right side and 2+ on the left side.      Patellar reflexes are 2+ on the right side and 2+ on the left side. Psychiatric:        Attention and Perception: Attention normal.        Mood and Affect: Mood normal.        Speech: Speech normal.        Behavior: Behavior normal. Behavior is cooperative.        Thought Content: Thought content normal.        Judgment: Judgment normal.    Results for orders placed or  performed during the hospital encounter of 08/31/16  Basic metabolic panel  Result Value Ref Range   Sodium 141 135 - 145 mmol/L   Potassium 4.0 3.5 - 5.1 mmol/L   Chloride 108 101 - 111 mmol/L   CO2 26 22 - 32 mmol/L   Glucose, Bld 85 65 - 99 mg/dL   BUN 15 6 - 20 mg/dL   Creatinine, Ser 4.090.74 0.44 - 1.00 mg/dL   Calcium 81.110.2 8.9 - 91.410.3 mg/dL   GFR calc non Af Amer >60 >60 mL/min   GFR calc Af Amer >60 >60 mL/min   Anion gap 7 5 - 15  CBC  Result Value Ref Range   WBC 10.1 3.6 - 11.0 K/uL   RBC 3.98 3.80 - 5.20 MIL/uL   Hemoglobin 12.9 12.0 - 16.0 g/dL   HCT 78.237.0 35 - 47 %   MCV 92.9 80.0 - 100.0 fL   MCH 32.3 26.0 - 34.0 pg   MCHC 34.8 32.0 - 36.0 g/dL   RDW 95.613.4 21.311.5 - 08.614.5 %   Platelets 367 150 - 440 K/uL  Troponin I  Result Value Ref Range   Troponin I <0.03 <0.03 ng/mL      Assessment & Plan:   Problem List Items Addressed This Visit      Other   Nicotine dependence, cigarettes, uncomplicated    Long time smoker, since age 54.  Sent in Wellbutrin script today per patient request.  She  wishes to work on cessation and has used this before.  Discussed annual lung screening and she is interested in this.  Placed [facility] and provided her with pamphlet on program.      Relevant Orders   Ambulatory Referral for Lung Cancer Scre    Other Visit Diagnoses    Routine general medical examination at a health care facility    -  Primary   Obtain annual labs today to include CBC, CMP, TSH, lipid, and FSH/LH (per patient request)   Relevant Orders   CBC with Differential/Platelet   Comprehensive metabolic panel   Lipid Panel w/o Chol/HDL Ratio   TSH   UA/M w/rflx Culture, Routine   Vitamin D deficiency       History of low level and would like checked on annual labs today, start supplement as needed.   Relevant Orders   VITAMIN D 25 Hydroxy (Vit-D Deficiency, Fractures)   Menopause       Would like FSH/ LH checked on annual labs -- last menstrual cycle 3 years ago.   Relevant Orders   FSH/LH   Screening cholesterol level       Lipid panel today.   Relevant Orders   Lipid Panel w/o Chol/HDL Ratio   Thyroid disorder screen       TSH on annual labs.   Relevant Orders   TSH   Encounter for screening mammogram for malignant neoplasm of breast       Mammogram ordered.   Relevant Orders   MM DIGITAL SCREENING BILATERAL       Follow up plan: Return in about 1 year (around 09/02/2020) for Annual physical.   LABORATORY TESTING:  - Pap smear: up to date  IMMUNIZATIONS:   - Tdap: Tetanus vaccination status reviewed: last tetanus booster within 10 years. - Influenza: Up to date - Pneumovax: Not applicable - Prevnar: Not applicable - HPV: Not applicable - Zostavax vaccine: Refused  SCREENING: -Mammogram: Ordered today  - Colonoscopy: Up to date  -  Bone Density: Not applicable  -Hearing Test: Not applicable  -Spirometry: Not applicable   PATIENT COUNSELING:   Advised to take 1 mg of folate supplement per day if capable of  pregnancy.   Sexuality: Discussed sexually transmitted diseases, partner selection, use of condoms, avoidance of unintended pregnancy  and contraceptive alternatives.   Advised to avoid cigarette smoking.  I discussed with the patient that most people either abstain from alcohol or drink within safe limits (<=14/week and <=4 drinks/occasion for males, <=7/weeks and <= 3 drinks/occasion for females) and that the risk for alcohol disorders and other health effects rises proportionally with the number of drinks per week and how often a drinker exceeds daily limits.  Discussed cessation/primary prevention of drug use and availability of treatment for abuse.   Diet: Encouraged to adjust caloric intake to maintain  or achieve ideal body weight, to reduce intake of dietary saturated fat and total fat, to limit sodium intake by avoiding high sodium foods and not adding table salt, and to maintain adequate dietary potassium and calcium preferably from fresh fruits, vegetables, and low-fat dairy products.    stressed the importance of regular exercise  Injury prevention: Discussed safety belts, safety helmets, smoke detector, smoking near bedding or upholstery.   Dental health: Discussed importance of regular tooth brushing, flossing, and dental visits.    NEXT PREVENTATIVE PHYSICAL DUE IN 1 YEAR. Return in about 1 year (around 09/02/2020) for Annual physical.

## 2019-09-03 NOTE — Patient Instructions (Addendum)
Please call at this number ([facility]) to schedule your mammogram. [phone] Healthy Eating Following a healthy eating pattern may help you to achieve and maintain a healthy body weight, reduce the risk of chronic disease, and live a long and productive life. It is important to follow a healthy eating pattern at an appropriate calorie level for your body. Your nutritional needs should be met primarily through food by choosing a variety of nutrient-rich foods. What are tips for following this plan? Reading food labels  Read labels and choose the following: ? Reduced or low sodium. ? Juices with 100% fruit juice. ? Foods with low saturated fats and high polyunsaturated and monounsaturated fats. ? Foods with whole grains, such as whole wheat, cracked wheat, brown rice, and wild rice. ? Whole grains that are fortified with folic acid. This is recommended for women who are pregnant or who want to become pregnant.  Read labels and avoid the following: ? Foods with a lot of added sugars. These include foods that contain brown sugar, corn sweetener, corn syrup, dextrose, fructose, glucose, high-fructose corn syrup, honey, invert sugar, lactose, malt syrup, maltose, molasses, raw sugar, sucrose, trehalose, or turbinado sugar.  Do not eat more than the following amounts of added sugar per day:  6 teaspoons (25 g) for women.  9 teaspoons (38 g) for men. ? Foods that contain processed or refined starches and grains. ? Refined grain products, such as white flour, degermed cornmeal, white bread, and white rice. Shopping  Choose nutrient-rich snacks, such as vegetables, whole fruits, and nuts. Avoid high-calorie and high-sugar snacks, such as potato chips, fruit snacks, and candy.  Use oil-based dressings and spreads on foods instead of solid fats such as butter, stick margarine, or cream cheese.  Limit pre-made sauces, mixes, and "instant" products such as flavored rice, instant  noodles, and ready-made pasta.  Try more plant-protein sources, such as tofu, tempeh, black beans, edamame, lentils, nuts, and seeds.  Explore eating plans such as the Mediterranean diet or vegetarian diet. Cooking  Use oil to saut or stir-fry foods instead of solid fats such as butter, stick margarine, or lard.  Try baking, boiling, grilling, or broiling instead of frying.  Remove the fatty part of meats before cooking.  Steam vegetables in water or broth. Meal planning   At meals, imagine dividing your plate into fourths: ? One-half of your plate is fruits and vegetables. ? One-fourth of your plate is whole grains. ? One-fourth of your plate is protein, especially lean meats, poultry, eggs, tofu, beans, or nuts.  Include low-fat dairy as part of your daily diet. Lifestyle  Choose healthy options in all settings, including home, work, school, restaurants, or stores.  Prepare your food safely: ? Wash your hands after handling raw meats. ? Keep food preparation surfaces clean by regularly washing with hot, soapy water. ? Keep raw meats separate from ready-to-eat foods, such as fruits and vegetables. ? Cook seafood, meat, poultry, and eggs to the recommended internal temperature. ? Store foods at safe temperatures. In general:  Keep cold foods at 40F (4.4C) or below.  Keep hot foods at 140F (60C) or above.  Keep your freezer at 0F (-17.8C) or below.  Foods are no longer safe to eat when they have been between the temperatures of 40-140F (4.4-60C) for more than 2 hours. What foods should I eat? Fruits Aim to eat 2 cup-equivalents of fresh, canned (in natural juice), or frozen fruits each day. Examples of 1 cup-equivalent of   fruit include 1 small apple, 8 large strawberries, 1 cup canned fruit,  cup dried fruit, or 1 cup 100% juice. Vegetables Aim to eat 2-3 cup-equivalents of fresh and frozen vegetables each day, including different varieties and colors. Examples  of 1 cup-equivalent of vegetables include 2 medium carrots, 2 cups raw, leafy greens, 1 cup chopped vegetable (raw or cooked), or 1 medium baked potato. Grains Aim to eat 6 ounce-equivalents of whole grains each day. Examples of 1 ounce-equivalent of grains include 1 slice of bread, 1 cup ready-to-eat cereal, 3 cups popcorn, or  cup cooked rice, pasta, or cereal. Meats and other proteins Aim to eat 5-6 ounce-equivalents of protein each day. Examples of 1 ounce-equivalent of protein include 1 egg, 1/2 cup nuts or seeds, or 1 tablespoon (16 g) peanut butter. A cut of meat or fish that is the size of a deck of cards is about 3-4 ounce-equivalents.  Of the protein you eat each week, try to have at least 8 ounces come from seafood. This includes salmon, trout, herring, and anchovies. Dairy Aim to eat 3 cup-equivalents of fat-free or low-fat dairy each day. Examples of 1 cup-equivalent of dairy include 1 cup (240 mL) milk, 8 ounces (250 g) yogurt, 1 ounces (44 g) natural cheese, or 1 cup (240 mL) fortified soy milk. Fats and oils  Aim for about 5 teaspoons (21 g) per day. Choose monounsaturated fats, such as canola and olive oils, avocados, peanut butter, and most nuts, or polyunsaturated fats, such as sunflower, corn, and soybean oils, walnuts, pine nuts, sesame seeds, sunflower seeds, and flaxseed. Beverages  Aim for six 8-oz glasses of water per day. Limit coffee to three to five 8-oz cups per day.  Limit caffeinated beverages that have added calories, such as soda and energy drinks.  Limit alcohol intake to no more than 1 drink a day for nonpregnant women and 2 drinks a day for men. One drink equals 12 oz of beer (355 mL), 5 oz of wine (148 mL), or 1 oz of hard liquor (44 mL). Seasoning and other foods  Avoid adding excess amounts of salt to your foods. Try flavoring foods with herbs and spices instead of salt.  Avoid adding sugar to foods.  Try using oil-based dressings, sauces, and  spreads instead of solid fats. This information is based on general [place] nutrition guidelines. For more information, visit choosemyplate.gov. Exact amounts may vary based on your nutrition needs. Summary  A healthy eating plan may help you to maintain a healthy weight, reduce the risk of chronic diseases, and stay active throughout your life.  Plan your meals. Make sure you eat the right portions of a variety of nutrient-rich foods.  Try baking, boiling, grilling, or broiling instead of frying.  Choose healthy options in all settings, including home, work, school, restaurants, or stores. This information is not intended to replace advice given to you by your health care provider. Make sure you discuss any questions you have with your health care provider. Document Revised: 04/07/2017 Document Reviewed: 04/07/2017 Elsevier Patient Education  2020 Elsevier Inc.  

## 2019-09-04 LAB — COMPREHENSIVE METABOLIC PANEL
ALT: 13 IU/L (ref 0–32)
AST: 15 IU/L (ref 0–40)
Albumin/Globulin Ratio: 2.1 (ref 1.2–2.2)
Albumin: 4.6 g/dL (ref 3.8–4.9)
Alkaline Phosphatase: 101 IU/L (ref 48–121)
BUN/Creatinine Ratio: 17 (ref 9–23)
BUN: 12 mg/dL (ref 6–24)
Bilirubin Total: 0.3 mg/dL (ref 0.0–1.2)
CO2: 26 mmol/L (ref 20–29)
Calcium: 10.7 mg/dL — ABNORMAL HIGH (ref 8.7–10.2)
Chloride: 102 mmol/L (ref 96–106)
Creatinine, Ser: 0.7 mg/dL (ref 0.57–1.00)
GFR calc Af Amer: 113 mL/min/{1.73_m2} (ref >59)
GFR calc non Af Amer: 98 mL/min/{1.73_m2} (ref >59)
Globulin, Total: 2.2 g/dL (ref 1.5–4.5)
Glucose: 88 mg/dL (ref 65–99)
Potassium: 4.8 mmol/L (ref 3.5–5.2)
Sodium: 140 mmol/L (ref 134–144)
Total Protein: 6.8 g/dL (ref 6.0–8.5)

## 2019-09-04 LAB — CBC WITH DIFFERENTIAL/PLATELET
Basophils Absolute: 0.1 10*3/uL (ref 0.0–0.2)
Basos: 1 %
EOS (ABSOLUTE): 0.1 10*3/uL (ref 0.0–0.4)
Eos: 2 %
Hematocrit: 39.4 % (ref 34.0–46.6)
Hemoglobin: 13.6 g/dL (ref 11.1–15.9)
Immature Grans (Abs): 0 10*3/uL (ref 0.0–0.1)
Immature Granulocytes: 0 %
Lymphocytes Absolute: 2.5 10*3/uL (ref 0.7–3.1)
Lymphs: 30 %
MCH: 32.5 pg (ref 26.6–33.0)
MCHC: 34.5 g/dL (ref 31.5–35.7)
MCV: 94 fL (ref 79–97)
Monocytes Absolute: 0.6 10*3/uL (ref 0.1–0.9)
Monocytes: 7 %
Neutrophils Absolute: 5.3 10*3/uL (ref 1.4–7.0)
Neutrophils: 60 %
Platelets: 436 10*3/uL (ref 150–450)
RBC: 4.19 x10E6/uL (ref 3.77–5.28)
RDW: 11.7 % (ref 11.7–15.4)
WBC: 8.6 10*3/uL (ref 3.4–10.8)

## 2019-09-04 LAB — LIPID PANEL W/O CHOL/HDL RATIO
Cholesterol, Total: 210 mg/dL — ABNORMAL HIGH (ref 100–199)
HDL: 53 mg/dL (ref >39)
LDL Chol Calc (NIH): 119 mg/dL — ABNORMAL HIGH (ref 0–99)
Triglycerides: 220 mg/dL — ABNORMAL HIGH (ref 0–149)
VLDL Cholesterol Cal: 38 mg/dL (ref 5–40)

## 2019-09-04 LAB — TSH: TSH: 2.81 u[IU]/mL (ref 0.450–4.500)

## 2019-09-04 LAB — FSH/LH
FSH: 93.2 m[IU]/mL
LH: 60.6 m[IU]/mL

## 2019-09-04 LAB — VITAMIN D 25 HYDROXY (VIT D DEFICIENCY, FRACTURES): Vit D, 25-Hydroxy: 30.7 ng/mL (ref 30.0–100.0)

## 2019-09-05 NOTE — Progress Notes (Signed)
Contacted via MyChart The 10-year ASCVD risk score Denman George DC Jr., et al., 2013) is: 4.3%   Values used to calculate the score:     Age: 54 years     Sex: Female     Is Non-Hispanic African American: No     Diabetic: No     Tobacco smoker: Yes     Systolic Blood Pressure: 115 mmHg     Is BP treated: No     HDL Cholesterol: 53 mg/dL     Total Cholesterol: 210 mg/dL  Good evening Shelly Tyler.  Your labs have returned.  Overall they look great, with one exception being cholesterol levels elevated.  Hormone testing looks like postmenopausal.  Vitamin D is on low side of normal, I would recommend starting Vitamin D3 1000 units daily which is good for bone and muscle health. - Your cholesterol is high, but continued recommendations to make lifestyle changes. Your LDL is above normal. The LDL is the bad cholesterol. Over time and in combination with inflammation and other factors, this contributes to plaque which in turn may lead to stroke and/or heart attack down the road. Sometimes high LDL is primarily genetic, and people might be eating all the right foods but still have high numbers. Other times, there is room for improvement in one's diet and eating healthier can bring this number down and potentially reduce one's risk of heart attack and/or stroke.   To reduce your LDL, Remember - more fruits and vegetables, more fish, and limit red meat and dairy products. More soy, nuts, beans, barley, lentils, oats and plant sterol ester enriched margarine instead of butter. I also encourage eliminating sugar and processed food. Remember, shop on the outside of the grocery store and visit your [employer]. If you would like to talk with me about dietary changes for your cholesterol, please let me know. We should recheck your cholesterol in 6 months. Keep being awesome!!  Thank you for allowing me to participate in your care. Kindest regards, Allex Madia

## 2019-09-07 ENCOUNTER — Telehealth: Payer: Self-pay

## 2019-09-07 DIAGNOSIS — Z122 Encounter for screening for malignant neoplasm of respiratory organs: Secondary | ICD-10-CM

## 2019-09-07 DIAGNOSIS — Z87891 Personal history of nicotine dependence: Secondary | ICD-10-CM

## 2019-09-07 NOTE — Telephone Encounter (Signed)
Contacted patient for lung CT screening clinic after receiving referral from [facility].  I left message for patient to call Glenna Fellows, lung navigator to learn more about program and schedule CT scan.

## 2019-09-15 NOTE — Telephone Encounter (Signed)
Received referral for initial lung cancer screening scan. Contacted patient and obtained smoking history,(current, 39 pack year) as well as answering questions related to screening process. Patient denies signs of lung cancer such as weight loss or hemoptysis. Patient denies comorbidity that would prevent curative treatment if lung cancer were found. Patient is scheduled for shared decision making visit and CT scan on 09/21/19.

## 2019-09-15 NOTE — Addendum Note (Signed)
Addended by: Jonne Ply on: 09/15/2019 12:10 PM   Modules accepted: Orders

## 2019-09-21 ENCOUNTER — Encounter: Payer: Self-pay | Admitting: Nurse Practitioner

## 2019-09-21 ENCOUNTER — Other Ambulatory Visit: Payer: Self-pay

## 2019-09-21 ENCOUNTER — Inpatient Hospital Stay: Payer: BC Managed Care – PPO | Attending: Oncology | Admitting: Nurse Practitioner

## 2019-09-21 ENCOUNTER — Ambulatory Visit
Admission: RE | Admit: 2019-09-21 | Discharge: 2019-09-21 | Disposition: A | Discharge status: Home / Self Care | Payer: BC Managed Care – PPO | Source: Ambulatory Visit | Attending: Oncology | Admitting: Oncology

## 2019-09-21 DIAGNOSIS — Z122 Encounter for screening for malignant neoplasm of respiratory organs: Secondary | ICD-10-CM | POA: Diagnosis present

## 2019-09-21 DIAGNOSIS — Z87891 Personal history of nicotine dependence: Secondary | ICD-10-CM | POA: Diagnosis not present

## 2019-09-21 DIAGNOSIS — F17210 Nicotine dependence, cigarettes, uncomplicated: Secondary | ICD-10-CM

## 2019-09-21 NOTE — Progress Notes (Signed)
Virtual Visit via Video Enabled Telemedicine Note   I connected with [name] on 09/21/19 at 3:00 PM EST by video enabled telemedicine visit and verified that I am speaking with the correct person using two identifiers.   I discussed the limitations, risks, security and privacy concerns of performing an evaluation and management service by telemedicine and the availability of in-person appointments. I also discussed with the patient that there may be a patient responsible charge related to this service. The patient expressed understanding and agreed to proceed.   Other persons participating in the visit and their role in the encounter: Burgess Estelle, RN- checking in patient & navigation  Patient's location: home  Provider's location: Clinic  Chief Complaint: Low Dose CT Screening  Patient agreed to evaluation by telemedicine to discuss shared decision making for consideration of low dose CT lung cancer screening.    In accordance with CMS guidelines, patient has met eligibility criteria including age, absence of signs or symptoms of lung cancer.  Social History   Tobacco Use  . Smoking status: Current Every Day Smoker    Packs/day: 1.00    Years: 39.00    Pack years: 39.00    Types: Cigarettes  . Smokeless tobacco: Never Used  Substance Use Topics  . Alcohol use: Yes    Alcohol/week: 3.0 standard drinks    Types: 3 Glasses of wine per week    Comment: occasional wine     A shared decision-making session was conducted prior to the performance of CT scan. This includes one or more decision aids, includes benefits and harms of screening, follow-up diagnostic testing, over-diagnosis, false positive rate, and total radiation exposure.   Counseling on the importance of adherence to annual lung cancer LDCT screening, impact of co-morbidities, and ability or willingness to undergo diagnosis and treatment is imperative for compliance of the program.   Counseling on the importance  of continued smoking cessation for former smokers; the importance of smoking cessation for current smokers, and information about tobacco cessation interventions have been given to patient including [place] and [facility] [place] programs.   Written order for lung cancer screening with LDCT has been given to the patient and any and all questions have been answered to the best of my abilities.    Yearly follow up will be coordinated by Burgess Estelle, Thoracic Navigator.  I discussed the assessment and treatment plan with the patient. The patient was provided an opportunity to ask questions and all were answered. The patient agreed with the plan and demonstrated an understanding of the instructions.   The patient was advised to call back or seek an in-person evaluation if the symptoms worsen or if the condition fails to improve as anticipated.   I provided 15 minutes of face-to-face video visit time during this encounter, and > 50% was spent counseling as documented under my assessment & plan.   Beckey Rutter, DNP, AGNP-C [place] [facility] [phone] (clinic)

## 2019-09-24 ENCOUNTER — Encounter: Payer: Self-pay | Admitting: *Deleted

## 2019-09-25 ENCOUNTER — Other Ambulatory Visit: Payer: Self-pay | Admitting: Nurse Practitioner

## 2019-09-25 NOTE — Telephone Encounter (Signed)
Requested Prescriptions  Pending Prescriptions Disp Refills  . buPROPion (WELLBUTRIN SR) 150 MG 12 hr tablet [Pharmacy Med Name: BUPROPION HCL SR 150 MG TABLET] 180 tablet 2    Sig: START WITH 150 MG (1 TABLET) ONCE A DAY BY MOUTH FOR 3 DAYS AND THEN INCREASE TO 150 MG (1 TABLET) TWICE A DAY BY MOUTH.     Psychiatry: Antidepressants - bupropion Passed - 09/25/2019  1:30 PM      Passed - Last BP in normal range    BP Readings from Last 1 Encounters:  09/03/19 115/75         Passed - Valid encounter within last 6 months    Recent Outpatient Visits          3 weeks ago Routine general medical examination at a health care facility   [facility] [place], Corrie Dandy T, NP   1 year ago Tinea corporis   [facility] [place], [place], DO   2 years ago Screening for colon cancer   [facility] Gabriel Cirri, NP   2 years ago Abrasion   [facility] Gabriel Cirri, NP   2 years ago Abrasion of left lower extremity, initial encounter   [facility] [place] Cirri, NP      Future Appointments            In 11 months Cann[place], Dorie Rank, NP [employer]

## 2019-10-11 ENCOUNTER — Ambulatory Visit
Admission: RE | Admit: 2019-10-11 | Discharge: 2019-10-11 | Disposition: A | Discharge status: Home / Self Care | Payer: BC Managed Care – PPO | Source: Ambulatory Visit | Attending: Nurse Practitioner | Admitting: Nurse Practitioner

## 2019-10-11 ENCOUNTER — Other Ambulatory Visit: Payer: Self-pay

## 2019-10-11 DIAGNOSIS — Z1231 Encounter for screening mammogram for malignant neoplasm of breast: Secondary | ICD-10-CM | POA: Insufficient documentation

## 2019-12-08 ENCOUNTER — Encounter: Payer: Self-pay | Admitting: Nurse Practitioner

## 2019-12-08 ENCOUNTER — Telehealth (INDEPENDENT_AMBULATORY_CARE_PROVIDER_SITE_OTHER): Payer: BC Managed Care – PPO | Admitting: Nurse Practitioner

## 2019-12-08 DIAGNOSIS — J3489 Other specified disorders of nose and nasal sinuses: Secondary | ICD-10-CM | POA: Diagnosis not present

## 2019-12-08 MED ORDER — PREDNISONE 20 MG PO TABS: 40.0000 mg | ORAL_TABLET | Freq: Every day | ORAL | 0 refills | Status: AC

## 2019-12-08 NOTE — Patient Instructions (Signed)

## 2019-12-08 NOTE — Assessment & Plan Note (Signed)
Acute x 5 days.  Have highly recommended Covid testing, due to symptoms and she is high risk due to not being vaccinated.  She agrees with this, will order.  If positive and within time limit may qualify for MAB.  At this time recommend she self quarantine until testing returns and symptoms improve, if positive will need to quarantine x 10 days.  Will send in script at this time for Prednisone to help with sinus pressure and discomfort.  Recommend continued use of OTC medications for symptoms relief and increased fluid intake.  Return to office as needed for worsening or ongoing symptoms.

## 2019-12-08 NOTE — Progress Notes (Signed)
There were no vitals taken for this visit.   Subjective:    Patient ID: Shelly Tyler, female    DOB: 05/08/1965, 54 y.o.   MRN: [id number]  HPI: Shelly Tyler is a 54 y.o. female  Chief Complaint  Patient presents with  . Sinus Problem    pt states for about 5 days she is having facial pressure, nasal congestion, feeling fatigue     . This visit was completed via telephone due to the restrictions of the COVID-19 pandemic. All issues as above were discussed and addressed but no physical exam was performed. If it was felt that the patient should be evaluated in the office, they were directed there. The patient verbally consented to this visit. Patient was unable to complete an audio/visual visit due to Technical difficulties,Lack of internet. Due to the catastrophic nature of the COVID-19 pandemic, this visit was done through audio contact only. . Location of the patient: home . Location of the provider: work . Those involved with this call:  . Provider: Aura Dials, DNP . CMA: Wilhemena Durie, CMA . Front Desk/Registration: Harriet Pho  . Time spent on call: 20 minutes on the phone discussing health concerns. 15 minutes total spent in review of patient's record and preparation of their chart.  . I verified patient identity using two factors (patient name and date of birth). Patient consents verbally to being seen via telemedicine visit today.    UPPER RESPIRATORY TRACT INFECTION Presents for acute visit, reporting sinus symptoms started about 5 days ago.  Denies loss of taste or smell.  She has not been Covid vaccinated. Fever: no Cough: a little bit Shortness of breath: no Wheezing: no Chest pain: no Chest tightness: no Chest congestion: yes Nasal congestion: yes Runny nose: no Post nasal drip: no Sneezing: no Sore throat: no Swollen glands: no Sinus pressure: yes Headache: yes Face pain: no Toothache: no Ear pain: none Ear pressure: none Eyes  red/itching:no Eye drainage/crusting: no  Vomiting: no vomiting -- nausea present Rash: no Fatigue: yes Sick contacts: no Strep contacts: no  Context: fluctuating Recurrent sinusitis: no Relief with OTC cold/cough medications: yes  Treatments attempted: cold/sinus   Relevant past medical, surgical, family and social history reviewed and updated as indicated. Interim medical history since our last visit reviewed. Allergies and medications reviewed and updated.  Review of Systems  Constitutional: Positive for fatigue. Negative for activity change, appetite change and fever.  HENT: Positive for congestion and sinus pressure. Negative for ear discharge, ear pain, facial swelling, postnasal drip, rhinorrhea, sinus pain, sneezing, sore throat and voice change.   Eyes: Negative for pain and visual disturbance.  Respiratory: Positive for cough and chest tightness. Negative for shortness of breath and wheezing.   Cardiovascular: Negative for chest pain, palpitations and leg swelling.  Gastrointestinal: Positive for nausea. Negative for abdominal distention, abdominal pain, constipation, diarrhea and vomiting.  Endocrine: Negative.   Musculoskeletal: Negative for myalgias.  Neurological: Positive for headaches. Negative for dizziness and numbness.  Psychiatric/Behavioral: Negative.     Per HPI unless specifically indicated above     Objective:    There were no vitals taken for this visit.  Wt Readings from Last 3 Encounters:  09/21/19 142 lb (64.4 kg)  09/03/19 145 lb (65.8 kg)  06/22/18 125 lb (56.7 kg)    Physical Exam   Unable to perform due to telephone visit only.  Results for orders placed or performed in visit on 09/03/19  CBC with Differential/Platelet  Result Value Ref Range   WBC 8.6 3.4 - 10.8 x10E3/uL   RBC 4.19 3.77 - 5.28 x10E6/uL   Hemoglobin 13.6 11.1 - 15.9 g/dL   Hematocrit 00.7 62.2 - 46.6 %   MCV 94 79 - 97 fL   MCH 32.5 26.6 - 33.0 pg   MCHC 34.5 31 - 35  g/dL   RDW 63.3 35.4 - 56.2 %   Platelets 436 150 - 450 x10E3/uL   Neutrophils 60 Not Estab. %   Lymphs 30 Not Estab. %   Monocytes 7 Not Estab. %   Eos 2 Not Estab. %   Basos 1 Not Estab. %   Neutrophils Absolute 5.3 1.40 - 7.00 x10E3/uL   Lymphocytes Absolute 2.5 0 - 3 x10E3/uL   Monocytes Absolute 0.6 0 - 0 x10E3/uL   EOS (ABSOLUTE) 0.1 0.0 - 0.4 x10E3/uL   Basophils Absolute 0.1 0 - 0 x10E3/uL   Immature Granulocytes 0 Not Estab. %   Immature Grans (Abs) 0.0 0.0 - 0.1 x10E3/uL  Comprehensive metabolic panel  Result Value Ref Range   Glucose 88 65 - 99 mg/dL   BUN 12 6 - 24 mg/dL   Creatinine, Ser 5.63 0.57 - 1.00 mg/dL   GFR calc non Af Amer 98 >59 mL/min/1.73   GFR calc Af Amer 113 >59 mL/min/1.73   BUN/Creatinine Ratio 17 9 - 23   Sodium 140 134 - 144 mmol/L   Potassium 4.8 3.5 - 5.2 mmol/L   Chloride 102 96 - 106 mmol/L   CO2 26 20 - 29 mmol/L   Calcium 10.7 (H) 8.7 - 10.2 mg/dL   Total Protein 6.8 6.0 - 8.5 g/dL   Albumin 4.6 3.8 - 4.9 g/dL   Globulin, Total 2.2 1.5 - 4.5 g/dL   Albumin/Globulin Ratio 2.1 1.2 - 2.2   Bilirubin Total 0.3 0.0 - 1.2 mg/dL   Alkaline Phosphatase 101 48 - 121 IU/L   AST 15 0 - 40 IU/L   ALT 13 0 - 32 IU/L  Lipid Panel w/o Chol/HDL Ratio  Result Value Ref Range   Cholesterol, Total 210 (H) 100 - 199 mg/dL   Triglycerides 893 (H) 0 - 149 mg/dL   HDL 53 >73 mg/dL   VLDL Cholesterol Cal 38 5 - 40 mg/dL   LDL Chol Calc (NIH) 428 (H) 0 - 99 mg/dL  TSH  Result Value Ref Range   TSH 2.810 0.450 - 4.500 uIU/mL  VITAMIN D 25 Hydroxy (Vit-D Deficiency, Fractures)  Result Value Ref Range   Vit D, 25-Hydroxy 30.7 30.0 - 100.0 ng/mL  FSH/LH  Result Value Ref Range   LH 60.6 mIU/mL   FSH 93.2 mIU/mL  UA/M w/rflx Culture, Routine   Specimen: Urine   Urine  Result Value Ref Range   Specific Gravity, UA 1.015 1.005 - 1.030   pH, UA 7.0 5.0 - 7.5   Color, UA Yellow Yellow   Appearance Ur Clear Clear   Leukocytes,UA Negative Negative    Protein,UA Negative Negative/Trace   Glucose, UA Negative Negative   Ketones, UA Negative Negative   RBC, UA Negative Negative   Bilirubin, UA Negative Negative   Urobilinogen, Ur 0.2 0.2 - 1.0 mg/dL   Nitrite, UA Negative Negative      Assessment & Plan:   Problem List Items Addressed This Visit      Other   Sinus pressure - Primary    Acute x 5 days.  Have highly recommended Covid testing, due to symptoms and she  is high risk due to not being vaccinated.  She agrees with this, will order.  If positive and within time limit may qualify for MAB.  At this time recommend she self quarantine until testing returns and symptoms improve, if positive will need to quarantine x 10 days.  Will send in script at this time for Prednisone to help with sinus pressure and discomfort.  Recommend continued use of OTC medications for symptoms relief and increased fluid intake.  Return to office as needed for worsening or ongoing symptoms.      Relevant Orders   Novel Coronavirus, NAA ([facility])      I discussed the assessment and treatment plan with the patient. The patient was provided an opportunity to ask questions and all were answered. The patient agreed with the plan and demonstrated an understanding of the instructions.   The patient was advised to call back or seek an in-person evaluation if the symptoms worsen or if the condition fails to improve as anticipated.   I provided 21+ minutes of time during this encounter.  Follow up plan: Return if symptoms worsen or fail to improve.

## 2019-12-09 LAB — NOVEL CORONAVIRUS, NAA: SARS-CoV-2, NAA: NOT DETECTED

## 2019-12-09 LAB — SARS-COV-2, NAA 2 DAY TAT

## 2019-12-09 NOTE — Progress Notes (Signed)
Contacted via MyChart

## 2020-09-02 ENCOUNTER — Encounter: Payer: Self-pay | Admitting: Nurse Practitioner

## 2020-09-02 DIAGNOSIS — E7800 Pure hypercholesterolemia, unspecified: Secondary | ICD-10-CM | POA: Insufficient documentation

## 2020-09-04 ENCOUNTER — Encounter: Payer: Self-pay | Admitting: Nurse Practitioner

## 2020-09-04 ENCOUNTER — Ambulatory Visit (INDEPENDENT_AMBULATORY_CARE_PROVIDER_SITE_OTHER): Payer: BC Managed Care – PPO | Admitting: Nurse Practitioner

## 2020-09-04 ENCOUNTER — Other Ambulatory Visit: Payer: Self-pay

## 2020-09-04 VITALS — BP 118/74 | HR 80 | Temp 99.1°F | Ht 63.9 in | Wt 131.8 lb

## 2020-09-04 DIAGNOSIS — J432 Centrilobular emphysema: Secondary | ICD-10-CM

## 2020-09-04 DIAGNOSIS — N200 Calculus of kidney: Secondary | ICD-10-CM | POA: Diagnosis not present

## 2020-09-04 DIAGNOSIS — Z0000 Encounter for general adult medical examination without abnormal findings: Secondary | ICD-10-CM

## 2020-09-04 DIAGNOSIS — Z9109 Other allergy status, other than to drugs and biological substances: Secondary | ICD-10-CM

## 2020-09-04 DIAGNOSIS — Z1231 Encounter for screening mammogram for malignant neoplasm of breast: Secondary | ICD-10-CM

## 2020-09-04 DIAGNOSIS — R8281 Pyuria: Secondary | ICD-10-CM

## 2020-09-04 DIAGNOSIS — E7800 Pure hypercholesterolemia, unspecified: Secondary | ICD-10-CM | POA: Diagnosis not present

## 2020-09-04 DIAGNOSIS — F17210 Nicotine dependence, cigarettes, uncomplicated: Secondary | ICD-10-CM

## 2020-09-04 DIAGNOSIS — I700 Atherosclerosis of aorta: Secondary | ICD-10-CM | POA: Insufficient documentation

## 2020-09-04 DIAGNOSIS — Z87442 Personal history of urinary calculi: Secondary | ICD-10-CM | POA: Insufficient documentation

## 2020-09-04 LAB — URINALYSIS, ROUTINE W REFLEX MICROSCOPIC
Bilirubin, UA: NEGATIVE
Glucose, UA: NEGATIVE
Ketones, UA: NEGATIVE
Leukocytes,UA: NEGATIVE
Nitrite, UA: NEGATIVE
Protein,UA: NEGATIVE
Specific Gravity, UA: 1.015 (ref 1.005–1.030)
Urobilinogen, Ur: 0.2 mg/dL (ref 0.2–1.0)
pH, UA: 6.5 (ref 5.0–7.5)

## 2020-09-04 LAB — MICROSCOPIC EXAMINATION

## 2020-09-04 NOTE — Assessment & Plan Note (Signed)
Noted on CT imaging September 2021 -- no current symptoms.  Monitor and recommend diet changes.

## 2020-09-04 NOTE — Assessment & Plan Note (Signed)
Noted on CT lung screening September 2021 -- moderate.  Recommend complete cessation of smoking, continue Wellbutrin and she plans to start patches with this.  Continue annual lung screening.  No current inhalers, consider in future. Spirometry next visit.

## 2020-09-04 NOTE — Patient Instructions (Signed)
Managing the Challenge of Quitting Smoking Quitting smoking is a physical and mental challenge. You will face cravings, withdrawal symptoms, and temptation. Before quitting, work with your health care provider to make a plan that can help you manage quitting. Preparation canhelp you quit and keep you from giving in. How to manage lifestyle changes Managing stress Stress can make you want to smoke, and wanting to smoke may cause stress. It is important to find ways to manage your stress. You might try some of the following: Practice relaxation techniques. Breathe slowly and deeply, in through your nose and out through your mouth. Listen to music. Soak in a bath or take a shower. Imagine a peaceful place or vacation. Get some support. Talk with family or friends about your stress. Join a support group. Talk with a counselor or therapist. Get some physical activity. Go for a walk, run, or bike ride. Play a favorite sport. Practice yoga.  Medicines Talk with your health care provider about medicines that might help you dealwith cravings and make quitting easier for you. Relationships Social situations can be difficult when you are quitting smoking. To manage this, you can: Avoid parties and other social situations where people might be smoking. Avoid alcohol. Leave right away if you have the urge to smoke. Explain to your family and friends that you are quitting smoking. Ask for support and let them know you might be a bit grumpy. Plan activities where smoking is not an option. General instructions Be aware that many people gain weight after they quit smoking. However, not everyone does. To keep from gaining weight, have a plan in place before you quit and stick to the plan after you quit. Your plan should include: Having healthy snacks. When you have a craving, it may help to: Eat popcorn, carrots, celery, or other cut vegetables. Chew sugar-free gum. Changing how you eat. Eat small  portion sizes at meals. Eat 4-6 small meals throughout the day instead of 1-2 large meals a day. Be mindful when you eat. Do not watch television or do other things that might distract you as you eat. Exercising regularly. Make time to exercise each day. If you do not have time for a long workout, do short bouts of exercise for 5-10 minutes several times a day. Do some form of strengthening exercise, such as weight lifting. Do some exercise that gets your heart beating and causes you to breathe deeply, such as walking fast, running, swimming, or biking. This is very important. Drinking plenty of water or other low-calorie or no-calorie drinks. Drink 6-8 glasses of water daily.  How to recognize withdrawal symptoms Your body and mind may experience discomfort as you try to get used to not having nicotine in your system. These effects are called withdrawal symptoms. They may include: Feeling hungrier than normal. Having trouble concentrating. Feeling irritable or restless. Having trouble sleeping. Feeling depressed. Craving a cigarette. To manage withdrawal symptoms: Avoid places, people, and activities that trigger your cravings. Remember why you want to quit. Get plenty of sleep. Avoid coffee and other caffeinated drinks. These may worsen some of your symptoms. These symptoms may surprise you. But be assured that they are normal to havewhen quitting smoking. How to manage cravings Come up with a plan for how to deal with your cravings. The plan should include the following: A definition of the specific situation you want to deal with. An alternative action you will take. A clear idea for how this action will help. The   name of someone who might help you with this. Cravings usually last for 5-10 minutes. Consider taking the following actions to help you with your plan to deal with cravings: Keep your mouth busy. Chew sugar-free gum. Suck on hard candies or a straw. Brush your  teeth. Keep your hands and body busy. Change to a different activity right away. Squeeze or play with a ball. Do an activity or a hobby, such as making bead jewelry, practicing needlepoint, or working with wood. Mix up your normal routine. Take a short exercise break. Go for a quick walk or run up and down stairs. Focus on doing something kind or helpful for someone else. Call a friend or family member to talk during a craving. Join a support group. Contact a quitline. Where to find support To get help or find a support group: Call the National Cancer Institute's Smoking Quitline: 1-800-QUIT NOW (784-8669) Visit the website of the Substance Abuse and Mental Health Services Administration: www.samhsa.gov Text QUIT to SmokefreeTXT: 478848 Where to find more information Visit these websites to find more information on quitting smoking: National Cancer Institute: www.smokefree.gov American Lung Association: www.lung.org American Cancer Society: www.cancer.org Centers for Disease Control and Prevention: www.cdc.gov American Heart Association: www.heart.org Contact a health care provider if: You want to change your plan for quitting. The medicines you are taking are not helping. Your eating feels out of control or you cannot sleep. Get help right away if: You feel depressed or become very anxious. Summary Quitting smoking is a physical and mental challenge. You will face cravings, withdrawal symptoms, and temptation to smoke again. Preparation can help you as you go through these challenges. Try different techniques to manage stress, handle social situations, and prevent weight gain. You can deal with cravings by keeping your mouth busy (such as by chewing gum), keeping your hands and body busy, calling family or friends, or contacting a quitline for people who want to quit smoking. You can deal with withdrawal symptoms by avoiding places where people smoke, getting plenty of rest, and  avoiding drinks with caffeine. This information is not intended to replace advice given to you by your health care provider. Make sure you discuss any questions you have with your healthcare provider. Document Revised: 10/13/2018 Document Reviewed: 10/13/2018 Elsevier Patient Education  2022 [employer].  

## 2020-09-04 NOTE — Addendum Note (Signed)
Addended by: Aura Dials T on: 09/04/2020 08:50 AM   Modules accepted: Orders

## 2020-09-04 NOTE — Progress Notes (Signed)
BP 118/74   Pulse 80   Temp 99.1 F (37.3 C) (Oral)   Ht 5' 3.9" (1.623 m)   Wt 131 lb 12.8 oz (59.8 kg)   SpO2 93%   BMI 22.69 kg/m    Subjective:    Patient ID: Shelly Tyler, female    DOB: Jan 01, 1966, 55 y.o.   MRN: [id number]  HPI: Shelly Tyler is a 55 y.o. female presenting on 09/04/2020 for comprehensive medical examination. Current medical complaints include:none  She currently lives with: significant other Menopausal Symptoms: no   Presents for request for physical. Is a smoker, smokes 1/2 PPD, would like to quit and is taking Wellbutrin which she reports is offering little benefit.  Has been a smoker since high school, has quit on and off through the years.  Smoking since age 59 -- 40 years of smoking.  Went for initial lung screening on 09/21/2019 -- this noted moderate centrilobular emphysema and aortic atherosclerosis.  Also noted a "3 mm interpolar right renal collecting system calculus", has never kidney stone issues in past, but her mother did.  She would like referral to allergist due to noticing new changes with environmental allergies and make-up.  Depression Screen done today and results listed below:  Depression screen [facility] 2/9 09/04/2020 09/03/2019 02/04/2017 12/24/2016 11/29/2016  Decreased Interest 0 3 1 1 2   Down, Depressed, Hopeless 0 3 0 1 3  PHQ - 2 Score 0 6 1 2 5   Altered sleeping - 2 0 2 3  Tired, decreased energy - 3 1 2 3   Change in appetite - 3 2 1 3   Feeling bad or failure about yourself  - 1 0 1 3  Trouble concentrating - 2 0 1 1  Moving slowly or fidgety/restless - 0 0 0 0  Suicidal thoughts - 0 0 0 0  PHQ-9 Score - 17 4 9 18     The patient does not have a history of falls. I did not complete a risk assessment for falls. A plan of care for falls was not documented.   Past Medical History:  Past Medical History:  Diagnosis Date   Anxiety    Depression    Migraine    OCD (obsessive compulsive disorder)    Tobacco abuse      Surgical History:  Past Surgical History:  Procedure Laterality Date   BREAST BIOPSY Right 2011   neg   BUNIONECTOMY     CESAREAN SECTION     COLONOSCOPY WITH PROPOFOL N/A 03/10/2017   Procedure: COLONOSCOPY WITH PROPOFOL;  Surgeon: , MD;  Location: [facility] ENDOSCOPY;  Service: Gastroenterology;  Laterality: N/A;   WISDOM TOOTH EXTRACTION      Medications:  Current Outpatient Medications on File Prior to Visit  Medication Sig   acetaminophen (TYLENOL) 325 MG tablet Take by mouth.   Ascorbic Acid (VITAMIN C PO) Take by mouth daily.   BIOTIN PO Take by mouth daily.   buPROPion (WELLBUTRIN SR) 150 MG 12 hr tablet START WITH 150 MG (1 TABLET) ONCE A DAY BY MOUTH FOR 3 DAYS AND THEN INCREASE TO 150 MG (1 TABLET) TWICE A DAY BY MOUTH.   Multiple Vitamins-Minerals (ZINC PO) Take by mouth daily.   Pediatric Multiple Vit-Vit C (VITAMIN DAILY PO) Take by mouth daily.   phentermine (ADIPEX-P) 37.5 MG tablet Take 37.5 mg by mouth every morning.   No current facility-administered medications on file prior to visit.    Allergies:  No Known Allergies  Social  History:  Social History   Socioeconomic History   Marital status: Married    Spouse name: Not on file   Number of children: Not on file   Years of education: Not on file   Highest education level: Not on file  Occupational History   Not on file  Tobacco Use   Smoking status: Every Day    Packs/day: 0.50    Years: 39.00    Pack years: 19.50    Types: Cigarettes   Smokeless tobacco: Never  Vaping Use   Vaping Use: Some days  Substance and Sexual Activity   Alcohol use: Yes    Alcohol/week: 3.0 standard drinks    Types: 3 Glasses of wine per week    Comment: occasional wine   Drug use: No   Sexual activity: Yes  Other Topics Concern   Not on file  Social History Narrative   Not on file   Social Determinants of Health   Financial Resource Strain: Low Risk    Difficulty of Paying Living Expenses: Not hard  at all  Food Insecurity: No Food Insecurity   Worried About Programme researcher, broadcasting/film/video in the Last Year: Never true   Ran Out of Food in the Last Year: Never true  Transportation Needs: No Transportation Needs   Lack of Transportation (Medical): No   Lack of Transportation (Non-Medical): No  Physical Activity: Insufficiently Active   Days of Exercise per Week: 2 days   Minutes of Exercise per Session: 30 min  Stress: No Stress Concern Present   Feeling of Stress : Only a little  Social Connections: Moderately Isolated   Frequency of Communication with Friends and Family: More than three times a week   Frequency of Social Gatherings with Friends and Family: More than three times a week   Attends Religious Services: Never   Database administrator or Organizations: No   Attends Engineer, structural: Never   Marital Status: Married  Catering manager Violence: Not At Risk   Fear of Current or Ex-Partner: No   Emotionally Abused: No   Physically Abused: No   Sexually Abused: No   Social History   Tobacco Use  Smoking Status Every Day   Packs/day: 0.50   Years: 39.00   Pack years: 19.50   Types: Cigarettes  Smokeless Tobacco Never   Social History   Substance and Sexual Activity  Alcohol Use Yes   Alcohol/week: 3.0 standard drinks   Types: 3 Glasses of wine per week   Comment: occasional wine    Family History:  Family History  Problem Relation Age of Onset   Diabetes type II Mother    Cancer Mother        lung   Diabetes Mother    Hypertension Mother    Osteoporosis Mother    Alcohol abuse Father    Cancer Paternal Grandmother        lung   Alzheimer's disease Maternal Grandmother    Breast cancer Maternal Aunt    Breast cancer Maternal Aunt     Past medical history, surgical history, medications, allergies, family history and social history reviewed with patient today and changes made to appropriate areas of the chart.   Review of Systems - negative All  other ROS negative except what is listed above and in the HPI.      Objective:    BP 118/74   Pulse 80   Temp 99.1 F (37.3 C) (Oral)   Ht 5'  3.9" (1.623 m)   Wt 131 lb 12.8 oz (59.8 kg)   SpO2 93%   BMI 22.69 kg/m   Wt Readings from Last 3 Encounters:  09/04/20 131 lb 12.8 oz (59.8 kg)  09/21/19 142 lb (64.4 kg)  09/03/19 145 lb (65.8 kg)    Physical Exam Constitutional:      General: She is awake. She is not in acute distress.    Appearance: She is well-developed. She is not ill-appearing.  HENT:     Head: Normocephalic and atraumatic.     Right Ear: Hearing, tympanic membrane, ear canal and external ear normal. No drainage.     Left Ear: Hearing, tympanic membrane, ear canal and external ear normal. No drainage.     Nose: Nose normal.     Right Sinus: No maxillary sinus tenderness or frontal sinus tenderness.     Left Sinus: No maxillary sinus tenderness or frontal sinus tenderness.     Mouth/Throat:     Mouth: Mucous membranes are moist.     Pharynx: Oropharynx is clear. Uvula midline. No pharyngeal swelling, oropharyngeal exudate or posterior oropharyngeal erythema.  Eyes:     General: Lids are normal.        Right eye: No discharge.        Left eye: No discharge.     Extraocular Movements: Extraocular movements intact.     Conjunctiva/sclera: Conjunctivae normal.     Pupils: Pupils are equal, round, and reactive to light.     Visual Fields: Right eye visual fields normal and left eye visual fields normal.  Neck:     Thyroid: No thyromegaly.     Vascular: No carotid bruit.     Trachea: Trachea normal.  Cardiovascular:     Rate and Rhythm: Normal rate and regular rhythm.     Heart sounds: Normal heart sounds. No murmur heard.   No gallop.  Pulmonary:     Effort: Pulmonary effort is normal. No accessory muscle usage or respiratory distress.     Breath sounds: Normal breath sounds.  Chest:  Breasts:    Right: Normal.     Left: Normal.     Comments: Dense  breast tissue, no masses palpated. Abdominal:     General: Bowel sounds are normal.     Palpations: Abdomen is soft. There is no hepatomegaly or splenomegaly.     Tenderness: There is no abdominal tenderness.  Musculoskeletal:        General: Normal range of motion.     Cervical back: Normal range of motion and neck supple.     Right lower leg: No edema.     Left lower leg: No edema.  Lymphadenopathy:     Head:     Right side of head: No submental, submandibular, tonsillar, preauricular or posterior auricular adenopathy.     Left side of head: No submental, submandibular, tonsillar, preauricular or posterior auricular adenopathy.     Cervical: No cervical adenopathy.     Upper Body:     Right upper body: No supraclavicular, axillary or pectoral adenopathy.     Left upper body: No supraclavicular, axillary or pectoral adenopathy.  Skin:    General: Skin is warm and dry.     Capillary Refill: Capillary refill takes less than 2 seconds.     Findings: No rash.  Neurological:     Mental Status: She is alert and oriented to person, place, and time.     Cranial Nerves: Cranial nerves are intact.  Gait: Gait is intact.     Deep Tendon Reflexes: Reflexes are normal and symmetric.     Reflex Scores:      Brachioradialis reflexes are 2+ on the right side and 2+ on the left side.      Patellar reflexes are 2+ on the right side and 2+ on the left side. Psychiatric:        Attention and Perception: Attention normal.        Mood and Affect: Mood normal.        Speech: Speech normal.        Behavior: Behavior normal. Behavior is cooperative.        Thought Content: Thought content normal.        Judgment: Judgment normal.   Results for orders placed or performed in visit on 12/08/19  Novel Coronavirus, NAA ([facility])   Specimen: Nasopharyngeal(NP) swabs in vial transport medium  Result Value Ref Range   SARS-CoV-2, NAA Not Detected Not Detected  SARS-COV-2, NAA 2 DAY TAT  Result Value  Ref Range   SARS-CoV-2, NAA 2 DAY TAT Performed       Assessment & Plan:   Problem List Items Addressed This Visit       Cardiovascular and Mediastinum   Aortic atherosclerosis (HCC)    Noted on CT imaging September 2021.  Recommend heavy focus on diet changes.  Consider addition of statin in future.  Recommend complete cessation of smoking.        Respiratory   Centrilobular emphysema (HCC) - Primary    Noted on CT lung screening September 2021 -- moderate.  Recommend complete cessation of smoking, continue Wellbutrin and she plans to start patches with this.  Continue annual lung screening.  No current inhalers, consider in future. Spirometry next visit.      Relevant Orders   CBC with Differential/Platelet   TSH     Genitourinary   Kidney stone    Noted on CT imaging September 2021 -- no current symptoms.  Monitor and recommend diet changes.        Other   Nicotine dependence, cigarettes, uncomplicated    Long time smoker, since age 55.  Refills on Wellbutrin sent.  She wishes to work on cessation and has used this before.  Continue annual lung screening.        Elevated low density lipoprotein (LDL) cholesterol level    Noted on past labs. No current medications, continue heavy focus on diet and initiate medication as needed. The 10-year ASCVD risk score Denman George(Goff DC Montez HagemanJr., et al., 2013) is: 4.8%   Values used to calculate the score:     Age: [age] years     Sex: Female     Is Non-Hispanic African American: No     Diabetic: No     Tobacco smoker: Yes     Systolic Blood Pressure: 118 mmHg     Is BP treated: No     HDL Cholesterol: 53 mg/dL     Total Cholesterol: 210 mg/dL       Relevant Orders   Comprehensive metabolic panel   Lipid Panel w/o Chol/HDL Ratio   Other Visit Diagnoses     Environmental allergies       Allergist referral in place.   Relevant Orders   Ambulatory referral to Allergy   Annual physical exam       Annual physical today with labs  obtained.  Health maintenane reviewed, refuses shingles and Covid vaccinations.   Encounter for  screening mammogram for malignant neoplasm of breast       Mammogram ordered   Relevant Orders   MM 3D SCREEN BREAST BILATERAL        Follow up plan: Return in about 1 year (around 09/04/2021) for Annual physical with spirometry.   LABORATORY TESTING:  - Pap smear: up to date  IMMUNIZATIONS:   - Tdap: Tetanus vaccination status reviewed: last tetanus booster within 10 years. - Influenza: Up to date - Pneumovax: Up To Date - Prevnar: Not applicable - HPV: Not applicable - Zostavax vaccine: Refused - Covid vaccines = refuses  SCREENING: -Mammogram: Ordered today  - Colonoscopy: Up to date  - Bone Density: Not applicable  -Hearing Test: Not applicable  -Spirometry: refuses  PATIENT COUNSELING:   Advised to take 1 mg of folate supplement per day if capable of pregnancy.   Sexuality: Discussed sexually transmitted diseases, partner selection, use of condoms, avoidance of unintended pregnancy  and contraceptive alternatives.   Advised to avoid cigarette smoking.  I discussed with the patient that most people either abstain from alcohol or drink within safe limits (<=14/week and <=4 drinks/occasion for males, <=7/weeks and <= 3 drinks/occasion for females) and that the risk for alcohol disorders and other health effects rises proportionally with the number of drinks per week and how often a drinker exceeds daily limits.  Discussed cessation/primary prevention of drug use and availability of treatment for abuse.   Diet: Encouraged to adjust caloric intake to maintain  or achieve ideal body weight, to reduce intake of dietary saturated fat and total fat, to limit sodium intake by avoiding high sodium foods and not adding table salt, and to maintain adequate dietary potassium and calcium preferably from fresh fruits, vegetables, and low-fat dairy products.    Stressed the importance of  regular exercise  Injury prevention: Discussed safety belts, safety helmets, smoke detector, smoking near bedding or upholstery.   Dental health: Discussed importance of regular tooth brushing, flossing, and dental visits.    NEXT PREVENTATIVE PHYSICAL DUE IN 1 YEAR. Return in about 1 year (around 09/04/2021) for Annual physical with spirometry.

## 2020-09-04 NOTE — Assessment & Plan Note (Signed)
Noted on past labs. No current medications, continue heavy focus on diet and initiate medication as needed. The 10-year ASCVD risk score Denman George DC Montez Hageman., et al., 2013) is: 4.8%   Values used to calculate the score:     Age: 55 years     Sex: Female     Is Non-Hispanic African American: No     Diabetic: No     Tobacco smoker: Yes     Systolic Blood Pressure: 118 mmHg     Is BP treated: No     HDL Cholesterol: 53 mg/dL     Total Cholesterol: 210 mg/dL

## 2020-09-04 NOTE — Assessment & Plan Note (Signed)
Long time smoker, since age 55.  Refills on Wellbutrin sent.  She wishes to work on cessation and has used this before.  Continue annual lung screening.

## 2020-09-04 NOTE — Assessment & Plan Note (Signed)
Noted on CT imaging September 2021.  Recommend heavy focus on diet changes.  Consider addition of statin in future.  Recommend complete cessation of smoking.

## 2020-09-04 NOTE — Addendum Note (Signed)
Addended by: Aura Dials T on: 09/04/2020 12:01 PM   Modules accepted: Orders

## 2020-09-05 LAB — CBC WITH DIFFERENTIAL/PLATELET
Basophils Absolute: 0.1 10*3/uL (ref 0.0–0.2)
Basos: 1 %
EOS (ABSOLUTE): 0.1 10*3/uL (ref 0.0–0.4)
Eos: 1 %
Hematocrit: 42 % (ref 34.0–46.6)
Hemoglobin: 14 g/dL (ref 11.1–15.9)
Immature Grans (Abs): 0 10*3/uL (ref 0.0–0.1)
Immature Granulocytes: 0 %
Lymphocytes Absolute: 2.5 10*3/uL (ref 0.7–3.1)
Lymphs: 31 %
MCH: 31.8 pg (ref 26.6–33.0)
MCHC: 33.3 g/dL (ref 31.5–35.7)
MCV: 96 fL (ref 79–97)
Monocytes Absolute: 0.7 10*3/uL (ref 0.1–0.9)
Monocytes: 8 %
Neutrophils Absolute: 4.8 10*3/uL (ref 1.4–7.0)
Neutrophils: 59 %
Platelets: 407 10*3/uL (ref 150–450)
RBC: 4.4 x10E6/uL (ref 3.77–5.28)
RDW: 11.6 % — ABNORMAL LOW (ref 11.7–15.4)
WBC: 8.2 10*3/uL (ref 3.4–10.8)

## 2020-09-05 LAB — COMPREHENSIVE METABOLIC PANEL
ALT: 9 IU/L (ref 0–32)
AST: 11 IU/L (ref 0–40)
Albumin/Globulin Ratio: 2.2 (ref 1.2–2.2)
Albumin: 4.4 g/dL (ref 3.8–4.9)
Alkaline Phosphatase: 80 IU/L (ref 44–121)
BUN/Creatinine Ratio: 17 (ref 9–23)
BUN: 13 mg/dL (ref 6–24)
Bilirubin Total: 0.3 mg/dL (ref 0.0–1.2)
CO2: 25 mmol/L (ref 20–29)
Calcium: 10.6 mg/dL — ABNORMAL HIGH (ref 8.7–10.2)
Chloride: 102 mmol/L (ref 96–106)
Creatinine, Ser: 0.76 mg/dL (ref 0.57–1.00)
Globulin, Total: 2 g/dL (ref 1.5–4.5)
Glucose: 99 mg/dL (ref 65–99)
Potassium: 4.4 mmol/L (ref 3.5–5.2)
Sodium: 138 mmol/L (ref 134–144)
Total Protein: 6.4 g/dL (ref 6.0–8.5)
eGFR: 92 mL/min/{1.73_m2} (ref >59)

## 2020-09-05 LAB — LIPID PANEL W/O CHOL/HDL RATIO
Cholesterol, Total: 194 mg/dL (ref 100–199)
HDL: 52 mg/dL (ref >39)
LDL Chol Calc (NIH): 116 mg/dL — ABNORMAL HIGH (ref 0–99)
Triglycerides: 146 mg/dL (ref 0–149)
VLDL Cholesterol Cal: 26 mg/dL (ref 5–40)

## 2020-09-05 LAB — TSH: TSH: 3.11 u[IU]/mL (ref 0.450–4.500)

## 2020-09-05 NOTE — Progress Notes (Signed)
Contacted via MyChart The 10-year ASCVD risk score Mikey Bussing DC Jr., et al., 2013) is: 4.5%   Values used to calculate the score:     Age: 55 years     Sex: Female     Is Non-Hispanic African American: No     Diabetic: No     Tobacco smoker: Yes     Systolic Blood Pressure: 466 mmHg     Is BP treated: No     HDL Cholesterol: 52 mg/dL     Total Cholesterol: 194 mg/dL   Good morning Amye, your labs have returned with exception of urine culture which is pending.  Will let you know when this returns and if any infection we need to treat. - CBC shows no anemia.  Kidney function, creatinine and eGFR, is normal.  Liver function, AST and ALT, is normal.   - Thyroid lab is normal. - Your LDL is above normal. The LDL is the bad cholesterol. Over time and in combination with inflammation and other factors, this contributes to plaque which in turn may lead to stroke and/or heart attack down the road. Sometimes high LDL is primarily genetic, and people might be eating all the right foods but still have high numbers. Other times, there is room for improvement in one's diet and eating healthier can bring this number down and potentially reduce one's risk of heart attack and/or stroke.   To reduce your LDL, Remember - more fruits and vegetables, more fish, and limit red meat and dairy products. More soy, nuts, beans, barley, lentils, oats and plant sterol ester enriched margarine instead of butter. I also encourage eliminating sugar and processed food. Remember, shop on the outside of the grocery store and visit your [employer]. If you would like to talk with me about dietary changes for your cholesterol, please let me know. We should recheck your cholesterol in 12 months.  Any questions? Keep being awesome!!  Thank you for allowing me to participate in your care.  I appreciate you. Kindest regards, Candies Palm

## 2020-09-06 NOTE — Progress Notes (Signed)
Contacted via MyChart   Good morning Lonnetta, your urine has returned showing growth present -- urine infection.  I am waiting on culture results and once returned will send in appropriate antibiotic coverage to pharmacy to treat this.  If any ongoing symptoms please return to see me ASAP.  Any questions? Keep being amazing!!  Thank you for allowing me to participate in your care.  I appreciate you. Kindest regards, Will Schier

## 2020-09-07 LAB — URINE CULTURE

## 2020-09-07 MED ORDER — NITROFURANTOIN MONOHYD MACRO 100 MG PO CAPS: 100.0000 mg | ORAL_CAPSULE | Freq: Two times a day (BID) | ORAL | 0 refills | Status: AC

## 2020-09-07 NOTE — Progress Notes (Signed)
Contacted via MyChart   Good afternoon Shelly Tyler. Urine culture has returned and I sent in 5 days of Macrobid to treat current infection, if any ongoing symptoms please return to office.  Have a good day!!

## 2020-09-07 NOTE — Addendum Note (Signed)
Addended by: Aura Dials T on: 09/07/2020 02:19 PM   Modules accepted: Orders

## 2020-10-13 MED ORDER — CIPROFLOXACIN HCL 500 MG PO TABS: 500.0000 mg | ORAL_TABLET | Freq: Every day | ORAL | 0 refills | Status: AC

## 2020-10-25 ENCOUNTER — Other Ambulatory Visit: Payer: Self-pay

## 2020-10-25 ENCOUNTER — Ambulatory Visit
Admission: RE | Admit: 2020-10-25 | Discharge: 2020-10-25 | Disposition: A | Discharge status: Home / Self Care | Payer: BC Managed Care – PPO | Source: Ambulatory Visit | Attending: Nurse Practitioner | Admitting: Nurse Practitioner

## 2020-10-25 DIAGNOSIS — Z1231 Encounter for screening mammogram for malignant neoplasm of breast: Secondary | ICD-10-CM | POA: Diagnosis not present

## 2020-10-30 NOTE — Progress Notes (Signed)
Good day, your mammogram has returned normal.  Do not need to repeat for one year.  Great news!! ?Keep being amazing!!  Thank you for allowing me to participate in your care.  I appreciate you. ?Kindest regards, ?Zerline Melchior ?

## 2020-11-06 ENCOUNTER — Ambulatory Visit: Payer: Self-pay | Admitting: Allergy

## 2021-02-19 ENCOUNTER — Telehealth (INDEPENDENT_AMBULATORY_CARE_PROVIDER_SITE_OTHER): Payer: BC Managed Care – PPO | Admitting: Nurse Practitioner

## 2021-02-19 ENCOUNTER — Encounter: Payer: Self-pay | Admitting: Nurse Practitioner

## 2021-02-19 DIAGNOSIS — J0110 Acute frontal sinusitis, unspecified: Secondary | ICD-10-CM

## 2021-02-19 DIAGNOSIS — U071 COVID-19: Secondary | ICD-10-CM | POA: Diagnosis not present

## 2021-02-19 MED ORDER — AMOXICILLIN-POT CLAVULANATE 875-125 MG PO TABS: 1.0000 | ORAL_TABLET | Freq: Two times a day (BID) | ORAL | 0 refills | Status: DC

## 2021-02-19 NOTE — Progress Notes (Signed)
There were no vitals taken for this visit.   Subjective:    Patient ID: Shelly Tyler, female    DOB: 09/16/1965, 56 y.o.   MRN: [id number]  HPI: Shelly Tyler is a 56 y.o. female  Chief Complaint  Patient presents with   URI    Pt states she has symptoms of congestion, headache, cough, and sinus pressure for a week. States she was around her son last week who tested positive for covid. States she took 2 covid tests last week that were negative, positive test last night.    COVID POSITIVE Pt states she has symptoms of congestion, headache, cough, fever, and sinus pressure for a week. States she was around her son last week who tested positive for covid. States she took 2 covid tests last week that were negative, positive test last night.  She feels like she isn't able to breath well through her nose.  Denies shortness of breath, chest pain or chest tightness, sore throat.    Relevant past medical, surgical, family and social history reviewed and updated as indicated. Interim medical history since our last visit reviewed. Allergies and medications reviewed and updated.  Review of Systems  Constitutional:  Positive for fatigue and fever.  HENT:  Positive for congestion, rhinorrhea, sinus pressure, sinus pain and sore throat.   Respiratory:  Positive for cough. Negative for shortness of breath.   Cardiovascular:  Negative for chest pain.  Skin:  Negative for rash.  Neurological:  Positive for headaches.   Per HPI unless specifically indicated above     Objective:    There were no vitals taken for this visit.  Wt Readings from Last 3 Encounters:  09/04/20 131 lb 12.8 oz (59.8 kg)  09/21/19 142 lb (64.4 kg)  09/03/19 145 lb (65.8 kg)    Physical Exam Vitals and nursing note reviewed.  HENT:     Head: Normocephalic.     Right Ear: Hearing normal.     Left Ear: Hearing normal.     Nose: Nose normal.  Eyes:     Pupils: Pupils are equal, round, and reactive to light.   Pulmonary:     Effort: Pulmonary effort is normal. No respiratory distress.  Neurological:     Mental Status: She is alert.  Psychiatric:        Mood and Affect: Mood normal.        Behavior: Behavior normal.        Thought Content: Thought content normal.        Judgment: Judgment normal.    Results for orders placed or performed in visit on 09/04/20  Microscopic Examination   Urine  Result Value Ref Range   WBC, UA 0-5 0 - 5 /hpf   RBC 0-2 0 - 2 /hpf   Epithelial Cells (non renal) 0-10 0 - 10 /hpf   Bacteria, UA Moderate (A) None seen/Few  Urine Culture   Specimen: Urine   UR  Result Value Ref Range   Urine Culture, Routine Final report (A)    Organism ID, Bacteria Escherichia coli (A)    ORGANISM ID, BACTERIA Not applicable    Antimicrobial Susceptibility Comment   CBC with Differential/Platelet  Result Value Ref Range   WBC 8.2 3.4 - 10.8 x10E3/uL   RBC 4.40 3.77 - 5.28 x10E6/uL   Hemoglobin 14.0 11.1 - 15.9 g/dL   Hematocrit 42.0 34.0 - 46.6 %   MCV 96 79 - 97 fL   MCH 31.8 26.6 -  33.0 pg   MCHC 33.3 31.5 - 35.7 g/dL   RDW 11.6 (L) 11.7 - 15.4 %   Platelets 407 150 - 450 x10E3/uL   Neutrophils 59 Not Estab. %   Lymphs 31 Not Estab. %   Monocytes 8 Not Estab. %   Eos 1 Not Estab. %   Basos 1 Not Estab. %   Neutrophils Absolute 4.8 1.4 - 7.0 x10E3/uL   Lymphocytes Absolute 2.5 0.7 - 3.1 x10E3/uL   Monocytes Absolute 0.7 0.1 - 0.9 x10E3/uL   EOS (ABSOLUTE) 0.1 0.0 - 0.4 x10E3/uL   Basophils Absolute 0.1 0.0 - 0.2 x10E3/uL   Immature Granulocytes 0 Not Estab. %   Immature Grans (Abs) 0.0 0.0 - 0.1 x10E3/uL  Comprehensive metabolic panel  Result Value Ref Range   Glucose 99 65 - 99 mg/dL   BUN 13 6 - 24 mg/dL   Creatinine, Ser 0.76 0.57 - 1.00 mg/dL   eGFR 92 >59 mL/min/1.73   BUN/Creatinine Ratio 17 9 - 23   Sodium 138 134 - 144 mmol/L   Potassium 4.4 3.5 - 5.2 mmol/L   Chloride 102 96 - 106 mmol/L   CO2 25 20 - 29 mmol/L   Calcium 10.6 (H) 8.7 - 10.2  mg/dL   Total Protein 6.4 6.0 - 8.5 g/dL   Albumin 4.4 3.8 - 4.9 g/dL   Globulin, Total 2.0 1.5 - 4.5 g/dL   Albumin/Globulin Ratio 2.2 1.2 - 2.2   Bilirubin Total 0.3 0.0 - 1.2 mg/dL   Alkaline Phosphatase 80 44 - 121 IU/L   AST 11 0 - 40 IU/L   ALT 9 0 - 32 IU/L  Lipid Panel w/o Chol/HDL Ratio  Result Value Ref Range   Cholesterol, Total 194 100 - 199 mg/dL   Triglycerides 146 0 - 149 mg/dL   HDL 52 >39 mg/dL   VLDL Cholesterol Cal 26 5 - 40 mg/dL   LDL Chol Calc (NIH) 116 (H) 0 - 99 mg/dL  TSH  Result Value Ref Range   TSH 3.110 0.450 - 4.500 uIU/mL  Urinalysis, Routine w reflex microscopic  Result Value Ref Range   Specific Gravity, UA 1.015 1.005 - 1.030   pH, UA 6.5 5.0 - 7.5   Color, UA Yellow Yellow   Appearance Ur Clear Clear   Leukocytes,UA Negative Negative   Protein,UA Negative Negative/Trace   Glucose, UA Negative Negative   Ketones, UA Negative Negative   RBC, UA Trace (A) Negative   Bilirubin, UA Negative Negative   Urobilinogen, Ur 0.2 0.2 - 1.0 mg/dL   Nitrite, UA Negative Negative   Microscopic Examination See below:       Assessment & Plan:   Problem List Items Addressed This Visit   None Visit Diagnoses     COVID-19    -  Primary   Discussed s/s to monitor for. Discussed anti virals and do not recommend since patient has been having symptoms for a week. FU if symtoms do not improve.   Acute non-recurrent frontal sinusitis       Will treat with Augmentin. Complete course of antibiotics. FU in office if symptoms do not improve.   Relevant Medications   amoxicillin-clavulanate (AUGMENTIN) 875-125 MG tablet        Follow up plan: No follow-ups on file.   This visit was completed via MyChart due to the restrictions of the COVID-19 pandemic. All issues as above were discussed and addressed. Physical exam was done as above through visual confirmation on MyChart.  If it was felt that the patient should be evaluated in the office, they were directed  there. The patient verbally consented to this visit. Location of the patient: Home Location of the provider: Office Those involved with this call:  Provider: Jon Billings, NP CMA: Yvonna Alanis, [place] Desk/Registration: Myrlene Broker This encounter was conducted via Video.  I spent 20 dedicated to the care of this patient on the date of this encounter to include previsit review of COVID symptoms and treatment for Sinus infection, face to face time with the patient, and post visit ordering of testing.

## 2021-03-07 ENCOUNTER — Telehealth: Payer: Self-pay | Admitting: *Deleted

## 2021-03-07 NOTE — Telephone Encounter (Signed)
LMTC to schedule yearly Lung CA CT Scan. ?

## 2021-03-30 ENCOUNTER — Other Ambulatory Visit: Payer: Self-pay

## 2021-03-30 DIAGNOSIS — Z87891 Personal history of nicotine dependence: Secondary | ICD-10-CM

## 2021-03-30 DIAGNOSIS — F17210 Nicotine dependence, cigarettes, uncomplicated: Secondary | ICD-10-CM

## 2021-04-17 ENCOUNTER — Other Ambulatory Visit: Payer: Self-pay

## 2021-04-17 ENCOUNTER — Ambulatory Visit
Admission: RE | Admit: 2021-04-17 | Discharge: 2021-04-17 | Disposition: A | Discharge status: Home / Self Care | Payer: BC Managed Care – PPO | Source: Ambulatory Visit | Attending: Acute Care | Admitting: Acute Care

## 2021-04-17 DIAGNOSIS — J432 Centrilobular emphysema: Secondary | ICD-10-CM | POA: Diagnosis not present

## 2021-04-17 DIAGNOSIS — F17210 Nicotine dependence, cigarettes, uncomplicated: Secondary | ICD-10-CM

## 2021-04-17 DIAGNOSIS — I700 Atherosclerosis of aorta: Secondary | ICD-10-CM | POA: Diagnosis not present

## 2021-04-17 DIAGNOSIS — Z122 Encounter for screening for malignant neoplasm of respiratory organs: Secondary | ICD-10-CM | POA: Insufficient documentation

## 2021-04-17 DIAGNOSIS — Z87891 Personal history of nicotine dependence: Secondary | ICD-10-CM | POA: Insufficient documentation

## 2021-04-19 ENCOUNTER — Other Ambulatory Visit: Payer: Self-pay

## 2021-04-19 DIAGNOSIS — F17210 Nicotine dependence, cigarettes, uncomplicated: Secondary | ICD-10-CM

## 2021-04-19 DIAGNOSIS — Z122 Encounter for screening for malignant neoplasm of respiratory organs: Secondary | ICD-10-CM

## 2021-04-19 DIAGNOSIS — Z87891 Personal history of nicotine dependence: Secondary | ICD-10-CM

## 2021-09-05 ENCOUNTER — Encounter: Payer: BC Managed Care – PPO | Admitting: Nurse Practitioner

## 2021-10-22 ENCOUNTER — Other Ambulatory Visit: Payer: Self-pay | Admitting: Nurse Practitioner

## 2021-10-22 DIAGNOSIS — Z1231 Encounter for screening mammogram for malignant neoplasm of breast: Secondary | ICD-10-CM

## 2021-10-23 ENCOUNTER — Ambulatory Visit (INDEPENDENT_AMBULATORY_CARE_PROVIDER_SITE_OTHER): Payer: BC Managed Care – PPO | Admitting: Nurse Practitioner

## 2021-10-23 ENCOUNTER — Other Ambulatory Visit (HOSPITAL_COMMUNITY)
Admission: RE | Admit: 2021-10-23 | Discharge: 2021-10-23 | Disposition: A | Discharge status: Home / Self Care | Payer: BC Managed Care – PPO | Source: Ambulatory Visit | Attending: Nurse Practitioner | Admitting: Nurse Practitioner

## 2021-10-23 ENCOUNTER — Encounter: Payer: Self-pay | Admitting: Nurse Practitioner

## 2021-10-23 ENCOUNTER — Ambulatory Visit: Payer: BC Managed Care – PPO | Admitting: Nurse Practitioner

## 2021-10-23 VITALS — BP 110/64 | HR 68 | Temp 98.6°F | Ht 64.0 in | Wt 130.1 lb

## 2021-10-23 DIAGNOSIS — E7800 Pure hypercholesterolemia, unspecified: Secondary | ICD-10-CM

## 2021-10-23 DIAGNOSIS — Z23 Encounter for immunization: Secondary | ICD-10-CM

## 2021-10-23 DIAGNOSIS — I700 Atherosclerosis of aorta: Secondary | ICD-10-CM

## 2021-10-23 DIAGNOSIS — J432 Centrilobular emphysema: Secondary | ICD-10-CM

## 2021-10-23 DIAGNOSIS — Z0000 Encounter for general adult medical examination without abnormal findings: Secondary | ICD-10-CM

## 2021-10-23 DIAGNOSIS — Z124 Encounter for screening for malignant neoplasm of cervix: Secondary | ICD-10-CM | POA: Diagnosis present

## 2021-10-23 DIAGNOSIS — F17210 Nicotine dependence, cigarettes, uncomplicated: Secondary | ICD-10-CM | POA: Diagnosis not present

## 2021-10-23 DIAGNOSIS — R8281 Pyuria: Secondary | ICD-10-CM

## 2021-10-23 DIAGNOSIS — R399 Unspecified symptoms and signs involving the genitourinary system: Secondary | ICD-10-CM | POA: Insufficient documentation

## 2021-10-23 DIAGNOSIS — E559 Vitamin D deficiency, unspecified: Secondary | ICD-10-CM

## 2021-10-23 LAB — URINALYSIS, ROUTINE W REFLEX MICROSCOPIC
Bilirubin, UA: NEGATIVE
Glucose, UA: NEGATIVE
Ketones, UA: NEGATIVE
Leukocytes,UA: NEGATIVE
Nitrite, UA: POSITIVE — AB
Protein,UA: NEGATIVE
Specific Gravity, UA: 1.02 (ref 1.005–1.030)
Urobilinogen, Ur: 1 mg/dL (ref 0.2–1.0)
pH, UA: 7 (ref 5.0–7.5)

## 2021-10-23 LAB — MICROSCOPIC EXAMINATION

## 2021-10-23 LAB — WET PREP FOR TRICH, YEAST, CLUE
Clue Cell Exam: POSITIVE — AB
Trichomonas Exam: NEGATIVE
Yeast Exam: NEGATIVE

## 2021-10-23 MED ORDER — METRONIDAZOLE 500 MG PO TABS: 500.0000 mg | ORAL_TABLET | Freq: Two times a day (BID) | ORAL | 0 refills | Status: AC

## 2021-10-23 MED ORDER — NITROFURANTOIN MONOHYD MACRO 100 MG PO CAPS: 100.0000 mg | ORAL_CAPSULE | Freq: Two times a day (BID) | ORAL | 0 refills | Status: AC

## 2021-10-23 NOTE — Progress Notes (Signed)
BP 110/64   Pulse 68   Temp 98.6 F (37 C) (Oral)   Ht $R'5\' 4"'rH$  (1.626 m)   Wt 130 lb 1.6 oz (59 kg)   SpO2 99%   BMI 22.33 kg/m    Subjective:    Patient ID: Shelly Tyler, female    DOB: 14-Mar-1965, 56 y.o.   MRN: [id number]  HPI: Shelly Tyler is a 56 y.o. female presenting on 10/23/2021 for comprehensive medical examination. Current medical complaints include: UTI symptoms  She currently lives with: significant other Menopausal Symptoms: no   URINARY SYMPTOMS Started with symptoms on weekend. Dysuria: no Urinary frequency: yes Urgency: yes Small volume voids: varies Symptom severity: yes Urinary incontinence: no Foul odor:  a little bit Hematuria: no Abdominal pain: no Back pain: no Suprapubic pain/pressure: yes Flank pain: no Fever:  no Vomiting: no Status: stable Previous urinary tract infection: yes Recurrent urinary tract infection: no Sexual activity: monogamous History of sexually transmitted disease: no Treatments attempted: increasing fluids    COPD Started CT lung CA screening on 09/21/19.  Recent continues to note moderate centrilobular/paraseptal emphysema and aortic atherosclerosis. Continues to smoke about 1/2 PPD, started around age 72 to 18.  She is trying Wellbutrin again at this time.  COPD status: stable Satisfied with current treatment?: yes Oxygen use: no Dyspnea frequency: none Cough frequency: none Rescue inhaler frequency:   Limitation of activity: no Productive cough: none Last Spirometry: none Pneumovax: Up To Date Influenza: Not up to Date refuses  The 10-year ASCVD risk score (Arnett DK, et al., 2019) is: 4.2%   Values used to calculate the score:     Age: 63 years     Sex: Female     Is Non-Hispanic African American: No     Diabetic: No     Tobacco smoker: Yes     Systolic Blood Pressure: 419 mmHg     Is BP treated: No     HDL Cholesterol: 52 mg/dL     Total Cholesterol: 194 mg/dL  Depression Screen done today and  results listed below:     10/23/2021    1:31 PM 09/04/2020    8:15 AM 09/03/2019   10:19 AM 02/04/2017    8:30 AM 12/24/2016    8:18 AM  Depression screen PHQ 2/9  Decreased Interest 0 0 $R'3 1 1  'OY$ Down, Depressed, Hopeless 0 0 3 0 1  PHQ - 2 Score 0 0 $R'6 1 2  'pn$ Altered sleeping 2  2 0 2  Tired, decreased energy $RemoveBeforeDE'2  3 1 2  'dSBoowLUqbXtepm$ Change in appetite $RemoveBef'1  3 2 1  'XSHSpwBpUw$ Feeling bad or failure about yourself  0  1 0 1  Trouble concentrating 0  2 0 1  Moving slowly or fidgety/restless 0  0 0 0  Suicidal thoughts 0  0 0 0  PHQ-9 Score $RemoveBef'5  17 4 9  'XurCfojYdW$ Difficult doing work/chores Not difficult at all          10/23/2021    1:32 PM 09/03/2019   10:20 AM  GAD 7 : Generalized Anxiety Score  Nervous, Anxious, on Edge 2 3  Control/stop worrying 0 3  Worry too much - different things 0 3  Trouble relaxing 1 2  Restless 0 0  Easily annoyed or irritable  1  Afraid - awful might happen 1 3  Total GAD 7 Score  15  Anxiety Difficulty Not difficult at all Somewhat difficult      12/24/2016  8:18 AM 09/03/2019   10:19 AM 09/04/2020    8:15 AM 10/23/2021    1:31 PM 10/23/2021    1:46 PM  [place] in the past year? No 0 0 0 0  Was there an injury with Fall?  0 0 0 0  Fall Risk Category Calculator  0 0 0 0  Fall Risk Category  Low Low Low Low  Patient Fall Risk Level  Low fall risk Low fall risk Low fall risk Low fall risk  Patient at Risk for Falls Due to   No Fall Risks No Fall Risks No Fall Risks  Fall risk Follow up   Falls evaluation completed Falls evaluation completed Falls prevention discussed    Functional Status Survey: Is the patient deaf or have difficulty hearing?: No Does the patient have difficulty seeing, even when wearing glasses/contacts?: No Does the patient have difficulty concentrating, remembering, or making decisions?: No Does the patient have difficulty walking or climbing stairs?: No Does the patient have difficulty dressing or bathing?: No Does the patient have difficulty doing  errands alone such as visiting a doctor's office or shopping?: No   Past Medical History:  Past Medical History:  Diagnosis Date   Anxiety    Depression    Migraine    OCD (obsessive compulsive disorder)    Tobacco abuse     Surgical History:  Past Surgical History:  Procedure Laterality Date   BREAST BIOPSY Right 2011   neg   BUNIONECTOMY     CESAREAN SECTION     COLONOSCOPY WITH PROPOFOL N/A 03/10/2017   Procedure: COLONOSCOPY WITH PROPOFOL;  Surgeon: Jonathon Bellows, MD;  Location: [facility] ENDOSCOPY;  Service: Gastroenterology;  Laterality: N/A;   WISDOM TOOTH EXTRACTION      Medications:  Current Outpatient Medications on File Prior to Visit  Medication Sig   acetaminophen (TYLENOL) 325 MG tablet Take by mouth.   Ascorbic Acid (VITAMIN C PO) Take by mouth daily.   BIOTIN PO Take by mouth daily.   buPROPion (WELLBUTRIN SR) 150 MG 12 hr tablet START WITH 150 MG (1 TABLET) ONCE A DAY BY MOUTH FOR 3 DAYS AND THEN INCREASE TO 150 MG (1 TABLET) TWICE A DAY BY MOUTH.   Multiple Vitamins-Minerals (ZINC PO) Take by mouth daily.   No current facility-administered medications on file prior to visit.    Allergies:  No Known Allergies  Social History:  Social History   Socioeconomic History   Marital status: Married    Spouse name: Not on file   Number of children: Not on file   Years of education: Not on file   Highest education level: Not on file  Occupational History   Not on file  Tobacco Use   Smoking status: Every Day    Packs/day: 0.50    Years: 39.00    Total pack years: 19.50    Types: Cigarettes   Smokeless tobacco: Never  Vaping Use   Vaping Use: Some days  Substance and Sexual Activity   Alcohol use: Yes    Alcohol/week: 3.0 standard drinks of alcohol    Types: 3 Glasses of wine per week    Comment: occasional wine   Drug use: No   Sexual activity: Yes  Other Topics Concern   Not on file  Social History Narrative   Not on file   Social Determinants of  Health   Financial Resource Strain: Low Risk  (09/04/2020)   Overall Financial Resource Strain (CARDIA)  Difficulty of Paying Living Expenses: Not hard at all  Food Insecurity: No Food Insecurity (09/04/2020)   Hunger Vital Sign    Worried About Running Out of Food in the Last Year: Never true    Ran Out of Food in the Last Year: Never true  Transportation Needs: No Transportation Needs (09/04/2020)   PRAPARE - Hydrologist (Medical): No    Lack of Transportation (Non-Medical): No  Physical Activity: Insufficiently Active (09/04/2020)   Exercise Vital Sign    Days of Exercise per Week: 2 days    Minutes of Exercise per Session: 30 min  Stress: No Stress Concern Present (09/04/2020)   [place]    Feeling of Stress : Only a little  Social Connections: Moderately Isolated (09/04/2020)   Social Connection and Isolation Panel [NHANES]    Frequency of Communication with Friends and Family: More than three times a week    Frequency of Social Gatherings with Friends and Family: More than three times a week    Attends Religious Services: Never    Marine scientist or Organizations: No    Attends Archivist Meetings: Never    Marital Status: Married  Human resources officer Violence: Not At Risk (09/04/2020)   Humiliation, Afraid, Rape, and Kick questionnaire    Fear of Current or Ex-Partner: No    Emotionally Abused: No    Physically Abused: No    Sexually Abused: No   Social History   Tobacco Use  Smoking Status Every Day   Packs/day: 0.50   Years: 39.00   Total pack years: 19.50   Types: Cigarettes  Smokeless Tobacco Never   Social History   Substance and Sexual Activity  Alcohol Use Yes   Alcohol/week: 3.0 standard drinks of alcohol   Types: 3 Glasses of wine per week   Comment: occasional wine    Family History:  Family History  Problem Relation Age of Onset    Diabetes type II Mother    Cancer Mother        lung   Diabetes Mother    Hypertension Mother    Osteoporosis Mother    Alcohol abuse Father    Cancer Paternal Grandmother        lung   Alzheimer's disease Maternal Grandmother    Breast cancer Maternal Aunt    Breast cancer Maternal Aunt     Past medical history, surgical history, medications, allergies, family history and social history reviewed with patient today and changes made to appropriate areas of the chart.   Review of Systems - negative All other ROS negative except what is listed above and in the HPI.      Objective:    BP 110/64   Pulse 68   Temp 98.6 F (37 C) (Oral)   Ht $R'5\' 4"'oI$  (1.626 m)   Wt 130 lb 1.6 oz (59 kg)   SpO2 99%   BMI 22.33 kg/m   Wt Readings from Last 3 Encounters:  10/23/21 130 lb 1.6 oz (59 kg)  09/04/20 131 lb 12.8 oz (59.8 kg)  09/21/19 142 lb (64.4 kg)    Physical Exam Vitals and nursing note reviewed. Exam conducted with a chaperone present.  Constitutional:      General: She is awake. She is not in acute distress.    Appearance: She is well-developed and well-groomed. She is not ill-appearing or toxic-appearing.  HENT:     Head: Normocephalic  and atraumatic.     Right Ear: Hearing, tympanic membrane, ear canal and external ear normal. No drainage.     Left Ear: Hearing, tympanic membrane, ear canal and external ear normal. No drainage.     Nose: Nose normal.     Right Sinus: No maxillary sinus tenderness or frontal sinus tenderness.     Left Sinus: No maxillary sinus tenderness or frontal sinus tenderness.     Mouth/Throat:     Mouth: Mucous membranes are moist.     Pharynx: Oropharynx is clear. Uvula midline. No pharyngeal swelling, oropharyngeal exudate or posterior oropharyngeal erythema.  Eyes:     General: Lids are normal.        Right eye: No discharge.        Left eye: No discharge.     Extraocular Movements: Extraocular movements intact.     Conjunctiva/sclera:  Conjunctivae normal.     Pupils: Pupils are equal, round, and reactive to light.     Visual Fields: Right eye visual fields normal and left eye visual fields normal.  Neck:     Thyroid: No thyromegaly.     Vascular: No carotid bruit.     Trachea: Trachea normal.  Cardiovascular:     Rate and Rhythm: Normal rate and regular rhythm.     Heart sounds: Normal heart sounds. No murmur heard.    No gallop.  Pulmonary:     Effort: Pulmonary effort is normal. No accessory muscle usage or respiratory distress.     Breath sounds: Normal breath sounds.  Chest:  Breasts:    Right: Normal.     Left: Normal.     Comments: Dense breast tissue, no masses palpated. Abdominal:     General: Bowel sounds are normal.     Palpations: Abdomen is soft. There is no hepatomegaly or splenomegaly.     Tenderness: There is no abdominal tenderness.     Hernia: There is no hernia in the left inguinal area or right inguinal area.  Genitourinary:    Exam position: Lithotomy position.     Labia:        Right: No rash or tenderness.        Left: No rash or tenderness.      Vagina: Normal.     Cervix: Normal.     Uterus: Normal.      Adnexa: Right adnexa normal and left adnexa normal.     Comments: Cervix anterior, viewed -- pap obtained and sent to lab. Musculoskeletal:        General: Normal range of motion.     Cervical back: Normal range of motion and neck supple.     Right lower leg: No edema.     Left lower leg: No edema.  Lymphadenopathy:     Head:     Right side of head: No submental, submandibular, tonsillar, preauricular or posterior auricular adenopathy.     Left side of head: No submental, submandibular, tonsillar, preauricular or posterior auricular adenopathy.     Cervical: No cervical adenopathy.     Upper Body:     Right upper body: No supraclavicular, axillary or pectoral adenopathy.     Left upper body: No supraclavicular, axillary or pectoral adenopathy.  Skin:    General: Skin is warm  and dry.     Capillary Refill: Capillary refill takes less than 2 seconds.     Findings: No rash.  Neurological:     Mental Status: She is alert and oriented to person,  place, and time.     Gait: Gait is intact.     Deep Tendon Reflexes: Reflexes are normal and symmetric.     Reflex Scores:      Brachioradialis reflexes are 2+ on the right side and 2+ on the left side.      Patellar reflexes are 2+ on the right side and 2+ on the left side. Psychiatric:        Attention and Perception: Attention normal.        Mood and Affect: Mood normal.        Speech: Speech normal.        Behavior: Behavior normal. Behavior is cooperative.        Thought Content: Thought content normal.        Judgment: Judgment normal.    Results for orders placed or performed in visit on 09/04/20  Microscopic Examination   Urine  Result Value Ref Range   WBC, UA 0-5 0 - 5 /hpf   RBC, Urine 0-2 0 - 2 /hpf   Epithelial Cells (non renal) 0-10 0 - 10 /hpf   Bacteria, UA Moderate (A) None seen/Few  Urine Culture   Specimen: Urine   UR  Result Value Ref Range   Urine Culture, Routine Final report (A)    Organism ID, Bacteria Escherichia coli (A)    ORGANISM ID, BACTERIA Not applicable    Antimicrobial Susceptibility Comment   CBC with Differential/Platelet  Result Value Ref Range   WBC 8.2 3.4 - 10.8 x10E3/uL   RBC 4.40 3.77 - 5.28 x10E6/uL   Hemoglobin 14.0 11.1 - 15.9 g/dL   Hematocrit 42.0 34.0 - 46.6 %   MCV 96 79 - 97 fL   MCH 31.8 26.6 - 33.0 pg   MCHC 33.3 31.5 - 35.7 g/dL   RDW 11.6 (L) 11.7 - 15.4 %   Platelets 407 150 - 450 x10E3/uL   Neutrophils 59 Not Estab. %   Lymphs 31 Not Estab. %   Monocytes 8 Not Estab. %   Eos 1 Not Estab. %   Basos 1 Not Estab. %   Neutrophils Absolute 4.8 1.4 - 7.0 x10E3/uL   Lymphocytes Absolute 2.5 0.7 - 3.1 x10E3/uL   Monocytes Absolute 0.7 0.1 - 0.9 x10E3/uL   EOS (ABSOLUTE) 0.1 0.0 - 0.4 x10E3/uL   Basophils Absolute 0.1 0.0 - 0.2 x10E3/uL   Immature  Granulocytes 0 Not Estab. %   Immature Grans (Abs) 0.0 0.0 - 0.1 x10E3/uL  Comprehensive metabolic panel  Result Value Ref Range   Glucose 99 65 - 99 mg/dL   BUN 13 6 - 24 mg/dL   Creatinine, Ser 0.76 0.57 - 1.00 mg/dL   eGFR 92 >59 mL/min/1.73   BUN/Creatinine Ratio 17 9 - 23   Sodium 138 134 - 144 mmol/L   Potassium 4.4 3.5 - 5.2 mmol/L   Chloride 102 96 - 106 mmol/L   CO2 25 20 - 29 mmol/L   Calcium 10.6 (H) 8.7 - 10.2 mg/dL   Total Protein 6.4 6.0 - 8.5 g/dL   Albumin 4.4 3.8 - 4.9 g/dL   Globulin, Total 2.0 1.5 - 4.5 g/dL   Albumin/Globulin Ratio 2.2 1.2 - 2.2   Bilirubin Total 0.3 0.0 - 1.2 mg/dL   Alkaline Phosphatase 80 44 - 121 IU/L   AST 11 0 - 40 IU/L   ALT 9 0 - 32 IU/L  Lipid Panel w/o Chol/HDL Ratio  Result Value Ref Range   Cholesterol, Total 194 100 -  199 mg/dL   Triglycerides 146 0 - 149 mg/dL   HDL 52 >39 mg/dL   VLDL Cholesterol Cal 26 5 - 40 mg/dL   LDL Chol Calc (NIH) 116 (H) 0 - 99 mg/dL  TSH  Result Value Ref Range   TSH 3.110 0.450 - 4.500 uIU/mL  Urinalysis, Routine w reflex microscopic  Result Value Ref Range   Specific Gravity, UA 1.015 1.005 - 1.030   pH, UA 6.5 5.0 - 7.5   Color, UA Yellow Yellow   Appearance Ur Clear Clear   Leukocytes,UA Negative Negative   Protein,UA Negative Negative/Trace   Glucose, UA Negative Negative   Ketones, UA Negative Negative   RBC, UA Trace (A) Negative   Bilirubin, UA Negative Negative   Urobilinogen, Ur 0.2 0.2 - 1.0 mg/dL   Nitrite, UA Negative Negative   Microscopic Examination See below:       Assessment & Plan:   Problem List Items Addressed This Visit       Cardiovascular and Mediastinum   Aortic atherosclerosis ([place])    Ongoing.  Noted on CT imaging September 2021.  Recommend heavy focus on diet changes.  Consider addition of statin in future.  Recommend complete cessation of smoking.      Relevant Orders   Comprehensive metabolic panel   Lipid Panel w/o Chol/HDL Ratio     Respiratory    Centrilobular emphysema (H[facility]) - Primary    Chronic.  Noted on CT lung screening September 2021 -- moderate.  Recommend complete cessation of smoking, continue Wellbutrin and she plans to start patches with this.  Continue annual lung screening.  No current inhalers, consider in future. Spirometry next visit.      Relevant Orders   CBC with Differential/Platelet   TSH     Other   Elevated low density lipoprotein (LDL) cholesterol level    Noted on past labs. No current medications, continue heavy focus on diet and initiate medication as needed. The 10-year ASCVD risk score (Arnett DK, et al., 2019) is: 4.2%   Values used to calculate the score:     Age: 18 years     Sex: Female     Is Non-Hispanic African American: No     Diabetic: No     Tobacco smoker: Yes     Systolic Blood Pressure: 034 mmHg     Is BP treated: No     HDL Cholesterol: 52 mg/dL     Total Cholesterol: 194 mg/dL       Relevant Orders   Comprehensive metabolic panel   Lipid Panel w/o Chol/HDL Ratio   Nicotine dependence, cigarettes, uncomplicated    Long time smoker, since age 44.  Continue Wellbutrin, refills as needed.  She wishes to work on cessation and has used this before.  Continue annual lung screening.        UTI symptoms    Acute for over a week.  Wet prep + clue cells and UA nitrites + and Leuks.  Will send for culture.  Start Flagyl BID for 7 days and Macrobid for 5 days.  Will change regimen as needed based on urine culture.  Educated on regimen and how to take.  Can take probiotic with these to ease stomach symptoms.      Relevant Orders   Urinalysis, Routine w reflex microscopic   WET PREP FOR TRICH, YEAST, CLUE   Other Visit Diagnoses     Vitamin D deficiency       History of low  levels reported, check today and start supplement as needed.   Relevant Orders   VITAMIN D 25 Hydroxy (Vit-D Deficiency, Fractures)   Pyuria       Urine culture sent.   Relevant Orders   Urine Culture    Cervical cancer screening       Pap obtained today and sent to lab.   Relevant Orders   Cytology - PAP   Encounter for annual physical exam       Annual physical with labs, health maintenance reviewed with patient.   Relevant Orders   TSH        Follow up plan: Return in about 6 months (around 04/24/2022) for COPD === spirometry.   LABORATORY TESTING:  - Pap smear: Obtain today  IMMUNIZATIONS:   - Tdap: Tetanus vaccination status reviewed: last tetanus booster within 10 years. - Influenza: Refuses - Pneumovax: Up To Date - Prevnar: Not applicable - HPV: Not applicable - Zostavax vaccine: refuses - Covid vaccines = refuses  SCREENING: -Mammogram: Scheduled for 11/20/21 - Colonoscopy: Up to date -- needs March 2024 - Bone Density: Not applicable  -Hearing Test: Not applicable  -Spirometry: refuses  PATIENT COUNSELING:   Advised to take 1 mg of folate supplement per day if capable of pregnancy.   Sexuality: Discussed sexually transmitted diseases, partner selection, use of condoms, avoidance of unintended pregnancy  and contraceptive alternatives.   Advised to avoid cigarette smoking.  I discussed with the patient that most people either abstain from alcohol or drink within safe limits (<=14/week and <=4 drinks/occasion for males, <=7/weeks and <= 3 drinks/occasion for females) and that the risk for alcohol disorders and other health effects rises proportionally with the number of drinks per week and how often a drinker exceeds daily limits.  Discussed cessation/primary prevention of drug use and availability of treatment for abuse.   Diet: Encouraged to adjust caloric intake to maintain  or achieve ideal body weight, to reduce intake of dietary saturated fat and total fat, to limit sodium intake by avoiding high sodium foods and not adding table salt, and to maintain adequate dietary potassium and calcium preferably from fresh fruits, vegetables, and low-fat dairy products.     Stressed the importance of regular exercise  Injury prevention: Discussed safety belts, safety helmets, smoke detector, smoking near bedding or upholstery.   Dental health: Discussed importance of regular tooth brushing, flossing, and dental visits.    NEXT PREVENTATIVE PHYSICAL DUE IN 1 YEAR. Return in about 6 months (around 04/24/2022) for COPD === spirometry.

## 2021-10-23 NOTE — Assessment & Plan Note (Signed)
Long time smoker, since age 56.  Continue Wellbutrin, refills as needed.  She wishes to work on cessation and has used this before.  Continue annual lung screening.

## 2021-10-23 NOTE — Patient Instructions (Signed)
Urinary Tract Infection, Adult A urinary tract infection (UTI) is an infection of any part of the urinary tract. The urinary tract includes: The kidneys. The ureters. The bladder. The urethra. These organs make, store, and get rid of pee (urine) in the body. What are the causes? This infection is caused by germs (bacteria) in your genital area. These germs grow and cause swelling (inflammation) of your urinary tract. What increases the risk? The following factors may make you more likely to develop this condition: Using a small, thin tube (catheter) to drain pee. Not being able to control when you pee or poop (incontinence). Being female. If you are female, these things can increase the risk: Using these methods to prevent pregnancy: A medicine that kills sperm (spermicide). A device that blocks sperm (diaphragm). Having low levels of a female hormone (estrogen). Being pregnant. You are more likely to develop this condition if: You have genes that add to your risk. You are sexually active. You take antibiotic medicines. You have trouble peeing because of: A prostate that is bigger than normal, if you are female. A blockage in the part of your body that drains pee from the bladder. A kidney stone. A nerve condition that affects your bladder. Not getting enough to drink. Not peeing often enough. You have other conditions, such as: Diabetes. A weak disease-fighting system (immune system). Sickle cell disease. Gout. Injury of the spine. What are the signs or symptoms? Symptoms of this condition include: Needing to pee right away. Peeing small amounts often. Pain or burning when peeing. Blood in the pee. Pee that smells bad or not like normal. Trouble peeing. Pee that is cloudy. Fluid coming from the vagina, if you are female. Pain in the belly or lower back. Other symptoms include: Vomiting. Not feeling hungry. Feeling mixed up (confused). This may be the first symptom in  older adults. Being tired and grouchy (irritable). A fever. Watery poop (diarrhea). How is this treated? Taking antibiotic medicine. Taking other medicines. Drinking enough water. In some cases, you may need to see a specialist. Follow these instructions at home:  Medicines Take over-the-counter and prescription medicines only as told by your doctor. If you were prescribed an antibiotic medicine, take it as told by your doctor. Do not stop taking it even if you start to feel better. General instructions Make sure you: Pee until your bladder is empty. Do not hold pee for a long time. Empty your bladder after sex. Wipe from front to back after peeing or pooping if you are a female. Use each tissue one time when you wipe. Drink enough fluid to keep your pee pale yellow. Keep all follow-up visits. Contact a doctor if: You do not get better after 1-2 days. Your symptoms go away and then come back. Get help right away if: You have very bad back pain. You have very bad pain in your lower belly. You have a fever. You have chills. You feeling like you will vomit or you vomit. Summary A urinary tract infection (UTI) is an infection of any part of the urinary tract. This condition is caused by germs in your genital area. There are many risk factors for a UTI. Treatment includes antibiotic medicines. Drink enough fluid to keep your pee pale yellow. This information is not intended to replace advice given to you by your health care provider. Make sure you discuss any questions you have with your health care provider. Document Revised: 08/06/2019 Document Reviewed: 08/06/2019 Elsevier Patient Education    2023 [employer]  

## 2021-10-23 NOTE — Assessment & Plan Note (Signed)
Acute for over a week.  Wet prep + clue cells and UA nitrites + and Leuks.  Will send for culture.  Start Flagyl BID for 7 days and Macrobid for 5 days.  Will change regimen as needed based on urine culture.  Educated on regimen and how to take.  Can take probiotic with these to ease stomach symptoms.

## 2021-10-23 NOTE — Assessment & Plan Note (Signed)
Noted on past labs. No current medications, continue heavy focus on diet and initiate medication as needed. The 10-year ASCVD risk score (Arnett DK, et al., 2019) is: 4.2%   Values used to calculate the score:     Age: 56 years     Sex: Female     Is Non-Hispanic African American: No     Diabetic: No     Tobacco smoker: Yes     Systolic Blood Pressure: 482 mmHg     Is BP treated: No     HDL Cholesterol: 52 mg/dL     Total Cholesterol: 194 mg/dL

## 2021-10-23 NOTE — Assessment & Plan Note (Signed)
Chronic.  Noted on CT lung screening September 2021 -- moderate.  Recommend complete cessation of smoking, continue Wellbutrin and she plans to start patches with this.  Continue annual lung screening.  No current inhalers, consider in future. Spirometry next visit.

## 2021-10-23 NOTE — Assessment & Plan Note (Signed)
Ongoing.  Noted on CT imaging September 2021.  Recommend heavy focus on diet changes.  Consider addition of statin in future.  Recommend complete cessation of smoking.

## 2021-10-24 LAB — CBC WITH DIFFERENTIAL/PLATELET
Basophils Absolute: 0.1 10*3/uL (ref 0.0–0.2)
Basos: 1 %
EOS (ABSOLUTE): 0.1 10*3/uL (ref 0.0–0.4)
Eos: 1 %
Hematocrit: 43.4 % (ref 34.0–46.6)
Hemoglobin: 14.4 g/dL (ref 11.1–15.9)
Immature Grans (Abs): 0 10*3/uL (ref 0.0–0.1)
Immature Granulocytes: 0 %
Lymphocytes Absolute: 2.4 10*3/uL (ref 0.7–3.1)
Lymphs: 29 %
MCH: 32.4 pg (ref 26.6–33.0)
MCHC: 33.2 g/dL (ref 31.5–35.7)
MCV: 98 fL — ABNORMAL HIGH (ref 79–97)
Monocytes Absolute: 0.8 10*3/uL (ref 0.1–0.9)
Monocytes: 10 %
Neutrophils Absolute: 5 10*3/uL (ref 1.4–7.0)
Neutrophils: 59 %
Platelets: 426 10*3/uL (ref 150–450)
RBC: 4.45 x10E6/uL (ref 3.77–5.28)
RDW: 11.8 % (ref 11.7–15.4)
WBC: 8.4 10*3/uL (ref 3.4–10.8)

## 2021-10-24 LAB — COMPREHENSIVE METABOLIC PANEL
ALT: 11 IU/L (ref 0–32)
AST: 13 IU/L (ref 0–40)
Albumin/Globulin Ratio: 2.3 — ABNORMAL HIGH (ref 1.2–2.2)
Albumin: 4.8 g/dL (ref 3.8–4.9)
Alkaline Phosphatase: 87 IU/L (ref 44–121)
BUN/Creatinine Ratio: 19 (ref 9–23)
BUN: 15 mg/dL (ref 6–24)
Bilirubin Total: 0.2 mg/dL (ref 0.0–1.2)
CO2: 24 mmol/L (ref 20–29)
Calcium: 10.7 mg/dL — ABNORMAL HIGH (ref 8.7–10.2)
Chloride: 99 mmol/L (ref 96–106)
Creatinine, Ser: 0.79 mg/dL (ref 0.57–1.00)
Globulin, Total: 2.1 g/dL (ref 1.5–4.5)
Glucose: 91 mg/dL (ref 70–99)
Potassium: 4.4 mmol/L (ref 3.5–5.2)
Sodium: 137 mmol/L (ref 134–144)
Total Protein: 6.9 g/dL (ref 6.0–8.5)
eGFR: 87 mL/min/{1.73_m2} (ref >59)

## 2021-10-24 LAB — LIPID PANEL W/O CHOL/HDL RATIO
Cholesterol, Total: 201 mg/dL — ABNORMAL HIGH (ref 100–199)
HDL: 50 mg/dL (ref >39)
LDL Chol Calc (NIH): 118 mg/dL — ABNORMAL HIGH (ref 0–99)
Triglycerides: 191 mg/dL — ABNORMAL HIGH (ref 0–149)
VLDL Cholesterol Cal: 33 mg/dL (ref 5–40)

## 2021-10-24 LAB — TSH: TSH: 3.28 u[IU]/mL (ref 0.450–4.500)

## 2021-10-24 LAB — VITAMIN D 25 HYDROXY (VIT D DEFICIENCY, FRACTURES): Vit D, 25-Hydroxy: 27.7 ng/mL — ABNORMAL LOW (ref 30.0–100.0)

## 2021-10-24 NOTE — Progress Notes (Signed)
Contacted via MyChart The 10-year ASCVD risk score (Arnett DK, et al., 2019) is: 4.5%   Values used to calculate the score:     Age: 56 years     Sex: Female     Is Non-Hispanic African American: No     Diabetic: No     Tobacco smoker: Yes     Systolic Blood Pressure: 435 mmHg     Is BP treated: No     HDL Cholesterol: 50 mg/dL     Total Cholesterol: 201 mg/dL   Good afternoon Jayna, your labs have returned: - Kidney function, creatinine and eGFR, remains normal, as is liver function, AST and ALT.  - Vitamin D is a little low, ensure you are taking Vitamin D3 2000 units daily, which you can obtain over the counter for bone health. - Cholesterol levels a little elevated, we will recheck these next visit and continue diet and regular activity focus. - Calcium a little high, if taking any TUMS or calcium supplements please reduce these. - Remainder of blood work looks great.  Any questions? Keep being amazing!!  Thank you for allowing me to participate in your care.  I appreciate you. Kindest regards, Zaara Sprowl

## 2021-10-25 NOTE — Progress Notes (Signed)
Contacted via MyChart   Urine is noting infection, full culture not back yet but will let you know when is and if we need to change anything:)

## 2021-10-27 LAB — URINE CULTURE

## 2021-10-27 NOTE — Progress Notes (Signed)
Contacted via [place] is susceptible to the bacteria growing in your urine, meaning that we can use it to treat and get you better.  Continue dosing until complete and if ongoing symptoms, let me know:)

## 2021-10-29 LAB — CYTOLOGY - PAP
Adequacy: ABSENT
Comment: NEGATIVE
Diagnosis: NEGATIVE
High risk HPV: NEGATIVE

## 2021-10-29 NOTE — Progress Notes (Signed)
Contacted via MyChart   Pap has returned and overall negative!!  [employer].  Repeat in 5 years:)

## 2021-11-20 ENCOUNTER — Ambulatory Visit
Admission: RE | Admit: 2021-11-20 | Discharge: 2021-11-20 | Disposition: A | Discharge status: Home / Self Care | Payer: BC Managed Care – PPO | Source: Ambulatory Visit | Attending: Nurse Practitioner | Admitting: Nurse Practitioner

## 2021-11-20 DIAGNOSIS — Z1231 Encounter for screening mammogram for malignant neoplasm of breast: Secondary | ICD-10-CM | POA: Insufficient documentation

## 2021-11-22 ENCOUNTER — Encounter: Payer: Self-pay | Admitting: Nurse Practitioner

## 2021-11-22 NOTE — Progress Notes (Signed)
Contacted via MyChart   Normal mammogram, may repeat in one year:)

## 2022-02-01 ENCOUNTER — Encounter: Payer: Self-pay | Admitting: Nurse Practitioner

## 2022-04-01 ENCOUNTER — Ambulatory Visit: Payer: Self-pay | Admitting: *Deleted

## 2022-04-01 NOTE — Telephone Encounter (Signed)
Reason for Disposition  [1] Age > 36 AND [2] no history of prior similar back pain    Related to UTI  Answer Assessment - Initial Assessment Questions 1. ONSET: "When did the pain begin?"      I'm having lower back pain.    I had a UTI during my physical.    I didn't know I needed an appt.  For a UTI.     2. LOCATION: "Where does it hurt?" (upper, mid or lower back)     I can't do an appt.    I can't come in this week.   She gave me several reasons she could not come in. I looked and was able to get her in for 04/02/2022 at 8:20 so she took that appt.  3. SEVERITY: "How bad is the pain?"  (e.g., Scale 1-10; mild, moderate, or severe)   - MILD (1-3): Doesn't interfere with normal activities.    - MODERATE (4-7): Interferes with normal activities or awakens from sleep.    - SEVERE (8-10): Excruciating pain, unable to do any normal activities.      I'm having frequency.   No burning with urination.    I feel like I need a continuous antibiotic for UTI.   4. PATTERN: "Is the pain constant?" (e.g., yes, no; constant, intermittent)      Constant back pain 5. RADIATION: "Does the pain shoot into your legs or somewhere else?"     I need an appt. During a certain time.  It's so hard to get an appt. There. 6. CAUSE:  "What do you think is causing the back pain?"      UTI 7. BACK OVERUSE:  "Any recent lifting of heavy objects, strenuous work or exercise?"     No 8. MEDICINES: "What have you taken so far for the pain?" (e.g., nothing, acetaminophen, NSAIDS)     Tylenol 9. NEUROLOGIC SYMPTOMS: "Do you have any weakness, numbness, or problems with bowel/bladder control?"     No 10. OTHER SYMPTOMS: "Do you have any other symptoms?" (e.g., fever, abdomen pain, burning with urination, blood in urine)       A month 11. PREGNANCY: "Is there any chance you are pregnant?" "When was your last menstrual period?"       Not asked  Protocols used: Back Pain-A-AH

## 2022-04-01 NOTE — Telephone Encounter (Signed)
  Chief Complaint: Lower Back pain related to UTI Symptoms: I have these symptoms every time I get an UTI.   Frequency.   Not feel well Frequency: For a month now.   It's hard for me to come in. Pertinent Negatives: Patient denies blood in urine or burning. Disposition: [] ED /[] Urgent Care (no appt availability in office) / [x] Appointment(In office/virtual)/ []  [facility]/ [] Home Care/ [] Refused Recommended Disposition /[] [facility]/ []  Follow-up with PCP Additional Notes: I made her an appt. With Marnee Guarneri, NP for 04/02/2022 at 8:20.   Encouraged her to continue drinking plenty of water.

## 2022-04-02 ENCOUNTER — Ambulatory Visit: Payer: BC Managed Care – PPO | Admitting: Nurse Practitioner

## 2022-04-02 ENCOUNTER — Telehealth: Payer: Self-pay | Admitting: Gastroenterology

## 2022-04-02 ENCOUNTER — Encounter: Payer: Self-pay | Admitting: Nurse Practitioner

## 2022-04-02 VITALS — BP 137/80 | HR 66 | Temp 98.0°F | Ht 64.02 in | Wt 136.1 lb

## 2022-04-02 DIAGNOSIS — Z1211 Encounter for screening for malignant neoplasm of colon: Secondary | ICD-10-CM

## 2022-04-02 DIAGNOSIS — B9689 Other specified bacterial agents as the cause of diseases classified elsewhere: Secondary | ICD-10-CM

## 2022-04-02 DIAGNOSIS — N760 Acute vaginitis: Secondary | ICD-10-CM | POA: Diagnosis not present

## 2022-04-02 DIAGNOSIS — B379 Candidiasis, unspecified: Secondary | ICD-10-CM | POA: Insufficient documentation

## 2022-04-02 LAB — URINALYSIS, ROUTINE W REFLEX MICROSCOPIC
Bilirubin, UA: NEGATIVE
Glucose, UA: NEGATIVE
Ketones, UA: NEGATIVE
Nitrite, UA: NEGATIVE
Protein,UA: NEGATIVE
Specific Gravity, UA: 1.02 (ref 1.005–1.030)
Urobilinogen, Ur: 0.2 mg/dL (ref 0.2–1.0)
pH, UA: 7 (ref 5.0–7.5)

## 2022-04-02 LAB — MICROSCOPIC EXAMINATION: Bacteria, UA: NONE SEEN

## 2022-04-02 LAB — WET PREP FOR TRICH, YEAST, CLUE
Clue Cell Exam: POSITIVE — AB
Trichomonas Exam: NEGATIVE
Yeast Exam: NEGATIVE

## 2022-04-02 MED ORDER — METRONIDAZOLE 500 MG PO TABS: 500.0000 mg | ORAL_TABLET | Freq: Two times a day (BID) | ORAL | 0 refills | Status: AC

## 2022-04-02 NOTE — Progress Notes (Signed)
BP 137/80   Pulse 66   Temp 98 F (36.7 C) (Oral)   Ht 5' 4.02" (1.626 m)   Wt 136 lb 1.6 oz (61.7 kg)   SpO2 99%   BMI 23.35 kg/m    Subjective:    Patient ID: Shelly Tyler, female    DOB: 09-13-1965, 57 y.o.   MRN: [id number]  HPI: Shelly Tyler is a 57 y.o. female  Chief Complaint  Patient presents with   Back Pain    Patient thinks she has a UTI   URINARY SYMPTOMS Symptoms started 2 weeks ago and started to have back pain on Sunday.   Dysuria: no Urinary frequency: no Urgency: no Small volume voids: no Symptom severity: no Urinary incontinence: no Foul odor: yes Hematuria: no Abdominal pain: no Back pain: yes Suprapubic pain/pressure: no Flank pain: no Fever:  no Vomiting: no Status: stable Previous urinary tract infection: yes Recurrent urinary tract infection: no Sexual activity: monogamous History of sexually transmitted disease: no Treatments attempted: increasing fluids, Tylenol    Relevant past medical, surgical, family and social history reviewed and updated as indicated. Interim medical history since our last visit reviewed. Allergies and medications reviewed and updated.  Review of Systems  Constitutional:  Negative for activity change, appetite change, diaphoresis, fatigue and fever.  Respiratory:  Negative for cough, chest tightness and shortness of breath.   Cardiovascular:  Negative for chest pain, palpitations and leg swelling.  Gastrointestinal: Negative.   Genitourinary:  Negative for dysuria, flank pain, frequency, hematuria, urgency, vaginal bleeding, vaginal discharge and vaginal pain.  Musculoskeletal:  Positive for back pain.  Neurological: Negative.   Psychiatric/Behavioral: Negative.      Per HPI unless specifically indicated above     Objective:    BP 137/80   Pulse 66   Temp 98 F (36.7 C) (Oral)   Ht 5' 4.02" (1.626 m)   Wt 136 lb 1.6 oz (61.7 kg)   SpO2 99%   BMI 23.35 kg/m   Wt Readings from Last 3  Encounters:  04/02/22 136 lb 1.6 oz (61.7 kg)  10/23/21 130 lb 1.6 oz (59 kg)  09/04/20 131 lb 12.8 oz (59.8 kg)    Physical Exam Vitals and nursing note reviewed.  Constitutional:      General: She is awake. She is not in acute distress.    Appearance: She is well-developed and well-groomed. She is not ill-appearing or toxic-appearing.  HENT:     Head: Normocephalic.     Right Ear: Hearing and external ear normal.     Left Ear: Hearing and external ear normal.  Eyes:     General: Lids are normal.        Right eye: No discharge.        Left eye: No discharge.     Conjunctiva/sclera: Conjunctivae normal.     Pupils: Pupils are equal, round, and reactive to light.  Neck:     Thyroid: No thyromegaly.     Vascular: No carotid bruit or JVD.  Cardiovascular:     Rate and Rhythm: Normal rate and regular rhythm.     Heart sounds: Normal heart sounds. No murmur heard.    No gallop.  Pulmonary:     Effort: Pulmonary effort is normal. No accessory muscle usage or respiratory distress.     Breath sounds: Normal breath sounds.  Abdominal:     General: Bowel sounds are normal. There is no distension.     Palpations: Abdomen is soft.  Tenderness: There is no abdominal tenderness. There is no right CVA tenderness or left CVA tenderness.  Musculoskeletal:     Cervical back: Normal range of motion and neck supple.     Right lower leg: No edema.     Left lower leg: No edema.  Lymphadenopathy:     Cervical: No cervical adenopathy.  Skin:    General: Skin is warm and dry.  Neurological:     Mental Status: She is alert and oriented to person, place, and time.  Psychiatric:        Attention and Perception: Attention normal.        Mood and Affect: Mood normal.        Speech: Speech normal.        Behavior: Behavior normal. Behavior is cooperative.        Thought Content: Thought content normal.    Results for orders placed or performed in visit on 10/23/21  WET PREP FOR [place], YEAST,  CLUE   Specimen: Urine   Urine  Result Value Ref Range   Trichomonas Exam Negative Negative   Yeast Exam Negative Negative   Clue Cell Exam Positive (A) Negative  Urine Culture   Specimen: Urine   UR  Result Value Ref Range   Urine Culture, Routine Final report (A)    Organism ID, Bacteria Escherichia coli (A)    Antimicrobial Susceptibility Comment   Microscopic Examination   Urine  Result Value Ref Range   WBC, UA 0-5 0 - 5 /hpf   RBC, Urine 0-2 0 - 2 /hpf   Epithelial Cells (non renal) 0-10 0 - 10 /hpf   Bacteria, UA Many (A) None seen/Few  Urinalysis, Routine w reflex microscopic  Result Value Ref Range   Specific Gravity, UA 1.020 1.005 - 1.030   pH, UA 7.0 5.0 - 7.5   Color, UA Yellow Yellow   Appearance Ur Clear Clear   Leukocytes,UA Negative Negative   Protein,UA Negative Negative/Trace   Glucose, UA Negative Negative   Ketones, UA Negative Negative   RBC, UA 1+ (A) Negative   Bilirubin, UA Negative Negative   Urobilinogen, Ur 1.0 0.2 - 1.0 mg/dL   Nitrite, UA Positive (A) Negative   Microscopic Examination See below:   CBC with Differential/Platelet  Result Value Ref Range   WBC 8.4 3.4 - 10.8 x10E3/uL   RBC 4.45 3.77 - 5.28 x10E6/uL   Hemoglobin 14.4 11.1 - 15.9 g/dL   Hematocrit 43.4 34.0 - 46.6 %   MCV 98 (H) 79 - 97 fL   MCH 32.4 26.6 - 33.0 pg   MCHC 33.2 31.5 - 35.7 g/dL   RDW 11.8 11.7 - 15.4 %   Platelets 426 150 - 450 x10E3/uL   Neutrophils 59 Not Estab. %   Lymphs 29 Not Estab. %   Monocytes 10 Not Estab. %   Eos 1 Not Estab. %   Basos 1 Not Estab. %   Neutrophils Absolute 5.0 1.4 - 7.0 x10E3/uL   Lymphocytes Absolute 2.4 0.7 - 3.1 x10E3/uL   Monocytes Absolute 0.8 0.1 - 0.9 x10E3/uL   EOS (ABSOLUTE) 0.1 0.0 - 0.4 x10E3/uL   Basophils Absolute 0.1 0.0 - 0.2 x10E3/uL   Immature Granulocytes 0 Not Estab. %   Immature Grans (Abs) 0.0 0.0 - 0.1 x10E3/uL  Comprehensive metabolic panel  Result Value Ref Range   Glucose 91 70 - 99 mg/dL   BUN  15 6 - 24 mg/dL   Creatinine, Ser 0.79 0.57 -  1.00 mg/dL   eGFR 87 >59 mL/min/1.73   BUN/Creatinine Ratio 19 9 - 23   Sodium 137 134 - 144 mmol/L   Potassium 4.4 3.5 - 5.2 mmol/L   Chloride 99 96 - 106 mmol/L   CO2 24 20 - 29 mmol/L   Calcium 10.7 (H) 8.7 - 10.2 mg/dL   Total Protein 6.9 6.0 - 8.5 g/dL   Albumin 4.8 3.8 - 4.9 g/dL   Globulin, Total 2.1 1.5 - 4.5 g/dL   Albumin/Globulin Ratio 2.3 (H) 1.2 - 2.2   Bilirubin Total 0.2 0.0 - 1.2 mg/dL   Alkaline Phosphatase 87 44 - 121 IU/L   AST 13 0 - 40 IU/L   ALT 11 0 - 32 IU/L  VITAMIN D 25 Hydroxy (Vit-D Deficiency, Fractures)  Result Value Ref Range   Vit D, 25-Hydroxy 27.7 (L) 30.0 - 100.0 ng/mL  Lipid Panel w/o Chol/HDL Ratio  Result Value Ref Range   Cholesterol, Total 201 (H) 100 - 199 mg/dL   Triglycerides 191 (H) 0 - 149 mg/dL   HDL 50 >39 mg/dL   VLDL Cholesterol Cal 33 5 - 40 mg/dL   LDL Chol Calc (NIH) 118 (H) 0 - 99 mg/dL  TSH  Result Value Ref Range   TSH 3.280 0.450 - 4.500 uIU/mL  Cytology - PAP  Result Value Ref Range   High risk HPV Negative    Adequacy      Satisfactory for evaluation; transformation zone component ABSENT.   Diagnosis      - Negative for intraepithelial lesion or malignancy (NILM)   Comment Normal Reference Range HPV - Negative       Assessment & Plan:   Problem List Items Addressed This Visit       Genitourinary   Bacterial vaginal infection - Primary    Acute for 2 weeks.  UA overall reassuring.  Wet prep + clue cells.  Discussed at length with patient, has had similar in past.  Start Flagyl 500 MG BID for 7 days, tolerated in past.  Educated on medication.  Return if symptoms ongoing.      Relevant Medications   metroNIDAZOLE (FLAGYL) 500 MG tablet   Other Relevant Orders   Urinalysis, Routine w reflex microscopic   WET PREP FOR [place], YEAST, CLUE   Other Visit Diagnoses     Colon cancer screening       GI referral placed.   Relevant Orders   Ambulatory referral to  Gastroenterology        Follow up plan: Return if symptoms worsen or fail to improve.

## 2022-04-02 NOTE — Patient Instructions (Signed)

## 2022-04-02 NOTE — Telephone Encounter (Signed)
Patient left vm wanting to schedule colonoscopy. Requesting call back.  

## 2022-04-02 NOTE — Assessment & Plan Note (Signed)
Acute for 2 weeks.  UA overall reassuring.  Wet prep + clue cells.  Discussed at length with patient, has had similar in past.  Start Flagyl 500 MG BID for 7 days, tolerated in past.  Educated on medication.  Return if symptoms ongoing.

## 2022-04-04 ENCOUNTER — Telehealth: Payer: Self-pay

## 2022-04-04 ENCOUNTER — Other Ambulatory Visit: Payer: Self-pay

## 2022-04-04 DIAGNOSIS — Z1211 Encounter for screening for malignant neoplasm of colon: Secondary | ICD-10-CM

## 2022-04-04 DIAGNOSIS — Z86010 Personal history of colonic polyps: Secondary | ICD-10-CM

## 2022-04-04 MED ORDER — NA SULFATE-K SULFATE-MG SULF 17.5-3.13-1.6 GM/177ML PO SOLN: 1.0000 | Freq: Once | ORAL | 0 refills | Status: AC

## 2022-04-04 NOTE — Telephone Encounter (Signed)
Gastroenterology Pre-Procedure Review  Request Date: 04/23/22 Requesting Physician: Dr. Vicente Males  PATIENT REVIEW QUESTIONS: The patient responded to the following health history questions as indicated:    1. Are you having any GI issues? no 2. Do you have a personal history of Polyps?  (last colonoscopy performed by Dr. Vicente Males 03/20/2017 entire colon normal. Results letter recommended repeat in 5 years and procedure report recommended repeat in 5 years) 3. Do you have a family history of Colon Cancer or Polyps? no 4. Diabetes Mellitus? no 5. Joint replacements in the past 12 months?no 6. Major health problems in the past 3 months?no 7. Any artificial heart valves, MVP, or defibrillator?no    MEDICATIONS & ALLERGIES:    Patient reports the following regarding taking any anticoagulation/antiplatelet therapy:   Plavix, Coumadin, Eliquis, Xarelto, Lovenox, Pradaxa, Brilinta, or Effient? no Aspirin? no  Patient confirms/reports the following medications:  Current Outpatient Medications  Medication Sig Dispense Refill   acetaminophen (TYLENOL) 325 MG tablet Take by mouth.     Ascorbic Acid (VITAMIN C PO) Take by mouth daily.     BIOTIN PO Take by mouth daily.     buPROPion (WELLBUTRIN SR) 150 MG 12 hr tablet START WITH 150 MG (1 TABLET) ONCE A DAY BY MOUTH FOR 3 DAYS AND THEN INCREASE TO 150 MG (1 TABLET) TWICE A DAY BY MOUTH. (Patient not taking: Reported on 04/02/2022) 180 tablet 2   metroNIDAZOLE (FLAGYL) 500 MG tablet Take 1 tablet (500 mg total) by mouth 2 (two) times daily for 7 days. 14 tablet 0   Multiple Vitamins-Minerals (ZINC PO) Take by mouth daily.     No current facility-administered medications for this visit.    Patient confirms/reports the following allergies:  No Known Allergies  No orders of the defined types were placed in this encounter.   AUTHORIZATION INFORMATION Primary Insurance: 1D#: Group #:  Secondary Insurance: 1D#: Group #:  SCHEDULE  INFORMATION: Date: 04/23/22 Time: Location: [facility]

## 2022-04-09 ENCOUNTER — Other Ambulatory Visit: Payer: Self-pay | Admitting: Nurse Practitioner

## 2022-04-19 ENCOUNTER — Ambulatory Visit
Admission: RE | Admit: 2022-04-19 | Discharge: 2022-04-19 | Disposition: A | Discharge status: Home / Self Care | Payer: BC Managed Care – PPO | Source: Ambulatory Visit | Attending: Acute Care | Admitting: Acute Care

## 2022-04-19 DIAGNOSIS — Z122 Encounter for screening for malignant neoplasm of respiratory organs: Secondary | ICD-10-CM | POA: Diagnosis present

## 2022-04-19 DIAGNOSIS — F17210 Nicotine dependence, cigarettes, uncomplicated: Secondary | ICD-10-CM | POA: Diagnosis present

## 2022-04-19 DIAGNOSIS — Z87891 Personal history of nicotine dependence: Secondary | ICD-10-CM

## 2022-04-22 ENCOUNTER — Encounter: Payer: Self-pay | Admitting: Gastroenterology

## 2022-04-22 ENCOUNTER — Other Ambulatory Visit: Payer: Self-pay | Admitting: Acute Care

## 2022-04-22 DIAGNOSIS — Z122 Encounter for screening for malignant neoplasm of respiratory organs: Secondary | ICD-10-CM

## 2022-04-22 DIAGNOSIS — F17210 Nicotine dependence, cigarettes, uncomplicated: Secondary | ICD-10-CM

## 2022-04-22 DIAGNOSIS — Z87891 Personal history of nicotine dependence: Secondary | ICD-10-CM

## 2022-04-23 ENCOUNTER — Encounter: Payer: Self-pay | Admitting: Gastroenterology

## 2022-04-23 ENCOUNTER — Ambulatory Visit: Payer: BC Managed Care – PPO | Admitting: Anesthesiology

## 2022-04-23 ENCOUNTER — Ambulatory Visit
Admission: RE | Admit: 2022-04-23 | Discharge: 2022-04-23 | Disposition: A | Discharge status: Home / Self Care | Payer: BC Managed Care – PPO | Attending: Gastroenterology | Admitting: Gastroenterology

## 2022-04-23 ENCOUNTER — Encounter
Admission: RE | Disposition: A | Discharge status: Home / Self Care | Payer: Self-pay | Source: Home / Self Care | Attending: Gastroenterology

## 2022-04-23 DIAGNOSIS — K635 Polyp of colon: Secondary | ICD-10-CM | POA: Diagnosis not present

## 2022-04-23 DIAGNOSIS — F419 Anxiety disorder, unspecified: Secondary | ICD-10-CM | POA: Insufficient documentation

## 2022-04-23 DIAGNOSIS — D126 Benign neoplasm of colon, unspecified: Secondary | ICD-10-CM | POA: Diagnosis not present

## 2022-04-23 DIAGNOSIS — F17210 Nicotine dependence, cigarettes, uncomplicated: Secondary | ICD-10-CM | POA: Insufficient documentation

## 2022-04-23 DIAGNOSIS — F32A Depression, unspecified: Secondary | ICD-10-CM | POA: Diagnosis not present

## 2022-04-23 DIAGNOSIS — Z1211 Encounter for screening for malignant neoplasm of colon: Secondary | ICD-10-CM | POA: Insufficient documentation

## 2022-04-23 DIAGNOSIS — Z86010 Personal history of colonic polyps: Secondary | ICD-10-CM | POA: Diagnosis not present

## 2022-04-23 HISTORY — PX: COLONOSCOPY WITH PROPOFOL: SHX5780

## 2022-04-23 SURGERY — COLONOSCOPY WITH PROPOFOL
Anesthesia: General

## 2022-04-23 MED ORDER — PROPOFOL 500 MG/50ML IV EMUL
INTRAVENOUS | Status: DC | PRN
  Administered 2022-04-23: 100 ug/kg/min via INTRAVENOUS

## 2022-04-23 MED ORDER — PROPOFOL 10 MG/ML IV BOLUS
INTRAVENOUS | Status: DC | PRN
  Administered 2022-04-23: 100 mg via INTRAVENOUS

## 2022-04-23 MED ORDER — LIDOCAINE HCL (CARDIAC) PF 100 MG/5ML IV SOSY
PREFILLED_SYRINGE | INTRAVENOUS | Status: DC | PRN
  Administered 2022-04-23: 50 mg via INTRAVENOUS

## 2022-04-23 MED ORDER — DEXMEDETOMIDINE HCL IN NACL 80 MCG/20ML IV SOLN
INTRAVENOUS | Status: DC | PRN
  Administered 2022-04-23: 8 ug via BUCCAL

## 2022-04-23 MED ORDER — SODIUM CHLORIDE 0.9 % IV SOLN
INTRAVENOUS | Status: DC
  Administered 2022-04-23: 1000 mL via INTRAVENOUS

## 2022-04-23 NOTE — Anesthesia Postprocedure Evaluation (Signed)
Anesthesia Post Note  Patient: Shelly Tyler  Procedure(s) Performed: COLONOSCOPY WITH PROPOFOL  Patient location during evaluation: Endoscopy Anesthesia Type: General Level of consciousness: awake and alert Pain management: pain level controlled Vital Signs Assessment: post-procedure vital signs reviewed and stable Respiratory status: spontaneous breathing, nonlabored ventilation, respiratory function stable and patient connected to nasal cannula oxygen Cardiovascular status: blood pressure returned to baseline and stable Postop Assessment: no apparent nausea or vomiting Anesthetic complications: no   No notable events documented.   Last Vitals:  Vitals:   04/23/22 0748 04/23/22 0843  BP: 108/75 105/70  Pulse: 65   Resp: 18 18  Temp: (!) 36.1 C (!) 35.7 C  SpO2: 100% 99%    Last Pain:  Vitals:   04/23/22 0903  TempSrc:   PainSc: 0-No pain                 Lenard Simmer

## 2022-04-23 NOTE — Anesthesia Preprocedure Evaluation (Signed)
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:   Pulmonary neg shortness of breath, neg COPD, neg recent URI, Current Smoker          Cardiovascular Exercise Tolerance: Good negative cardio ROS      Neuro/Psych  PSYCHIATRIC DISORDERS Anxiety Depression    negative neurological ROS     GI/Hepatic negative GI ROS, Neg liver ROS,,,  Endo/Other  negative endocrine ROS    Renal/GU negative Renal ROS  negative genitourinary   Musculoskeletal   Abdominal   Peds  Hematology negative hematology ROS (+)   Anesthesia Other Findings Past Medical History: No date: Anxiety No date: Depression No date: Migraine No date: OCD (obsessive compulsive disorder) No date: Tobacco abuse   Reproductive/Obstetrics negative OB ROS                             Anesthesia Physical Anesthesia Plan  ASA: 2  Anesthesia Plan: General   Post-op Pain Management:    Induction: Intravenous  PONV Risk Score and Plan: 2 and Propofol infusion and TIVA  Airway Management Planned: Nasal Cannula and Natural Airway  Additional Equipment:   Intra-op Plan:   Post-operative Plan:   Informed Consent: I have reviewed the patients History and Physical, chart, labs and discussed the procedure including the risks, benefits and alternatives for the proposed anesthesia with the patient or authorized representative who has indicated his/her understanding and acceptance.     Dental Advisory Given  Plan Discussed with:  Anesthesiologist, CRNA and Surgeon  Anesthesia Plan Comments:         Anesthesia Quick Evaluation

## 2022-04-23 NOTE — H&P (Signed)
[name], MD [address], [place], [place], [address] [address], [place], [place], [address] Phone: [phone]  Fax: [phone]  Primary Care Physician:  Marjie Skiff, NP   Pre-Procedure History & Physical: HPI:  Shelly Tyler is a 57 y.o. female is here for an colonoscopy.   Past Medical History:  Diagnosis Date   Anxiety    Depression    Migraine    OCD (obsessive compulsive disorder)    Tobacco abuse     Past Surgical History:  Procedure Laterality Date   BREAST BIOPSY Right 2011   neg   BUNIONECTOMY     CESAREAN SECTION     COLONOSCOPY WITH PROPOFOL N/A 03/10/2017   Procedure: COLONOSCOPY WITH PROPOFOL;  Surgeon: [name], MD;  Location[facility] ENDOSCOPY;  Service: Gastroenterology;  Laterality: N/A;   WISDOM TOOTH EXTRACTION      Prior to Admission medications   Medication Sig Start Date End Date Taking? Authorizing Provider  Ascorbic Acid (VITAMIN C PO) Take by mouth daily.   Yes [provider]  BIOTIN PO Take by mouth daily.   Yes [provider]  Multiple Vitamins-Minerals (ZINC PO) Take by mouth daily.   Yes [provider]  acetaminophen (TYLENOL) 325 MG tablet Take by mouth.    [provider]  buPROPion (WELLBUTRIN SR) 150 MG 12 hr tablet START WITH 150 MG (1 TABLET) ONCE A DAY BY MOUTH FOR 3 DAYS AND THEN INCREASE TO 150 MG (1 TABLET) TWICE A DAY BY MOUTH. Patient not taking: Reported on 04/02/2022 09/25/19   Aura Dials T, NP    Allergies as of 04/04/2022   (No Known Allergies)    Family History  Problem Relation Age of Onset   Diabetes type II Mother    Cancer Mother        lung   Diabetes Mother    Hypertension Mother    Osteoporosis Mother    Alcohol abuse Father    Cancer Paternal Grandmother        lung   Alzheimer's disease Maternal Grandmother    Breast cancer Maternal Aunt    Breast cancer Maternal Aunt     Social History   Socioeconomic History   Marital  status: Married    Spouse name: Not on file   Number of children: Not on file   Years of education: Not on file   Highest education level: Not on file  Occupational History   Not on file  Tobacco Use   Smoking status: Every Day    Packs/day: 0.50    Years: 39.00    Additional pack years: 0.00    Total pack years: 19.50    Types: Cigarettes   Smokeless tobacco: Never  Vaping Use   Vaping Use: Some days  Substance and Sexual Activity   Alcohol use: Yes    Alcohol/week: 3.0 standard drinks of alcohol    Types: 3 Glasses of wine per week    Comment: occasional wine   Drug use: No   Sexual activity: Yes  Other Topics Concern   Not on file  Social History Narrative   Not on file   Social Determinants of Health   Financial Resource Strain: Low Risk  (09/04/2020)   Overall Financial Resource Strain (CARDIA)    Difficulty of Paying Living Expenses: Not hard at all  Food Insecurity: No Food Insecurity (09/04/2020)   Hunger Vital Sign    Worried About Running Out of Food  in the Last Year: Never true    Ran Out of Food in the Last Year: Never true  Transportation Needs: No Transportation Needs (09/04/2020)   PRAPARE - Administrator, Civil Service (Medical): No    Lack of Transportation (Non-Medical): No  Physical Activity: Insufficiently Active (09/04/2020)   Exercise Vital Sign    Days of Exercise per Week: 2 days    Minutes of Exercise per Session: 30 min  Stress: No Stress Concern Present (09/04/2020)   [employer] of Occupational Health - Occupational Stress Questionnaire    Feeling of Stress : Only a little  Social Connections: Moderately Isolated (09/04/2020)   Social Connection and Isolation Panel [NHANES]    Frequency of Communication with Friends and Family: More than three times a week    Frequency of Social Gatherings with Friends and Family: More than three times a week    Attends Religious Services: Never    Database administrator or Organizations:  No    Attends Banker Meetings: Never    Marital Status: Married  Catering manager Violence: Not At Risk (09/04/2020)   Humiliation, Afraid, Rape, and Kick questionnaire    Fear of Current or Ex-Partner: No    Emotionally Abused: No    Physically Abused: No    Sexually Abused: No    Review of Systems: See HPI, otherwise negative ROS  Physical Exam: There were no vitals taken for this visit. General:   Alert,  pleasant and cooperative in NAD Head:  Normocephalic and atraumatic. Neck:  Supple; no masses or thyromegaly. Lungs:  Clear throughout to auscultation, normal respiratory effort.    Heart:  +S1, +S2, Regular rate and rhythm, No edema. Abdomen:  Soft, nontender and nondistended. Normal bowel sounds, without guarding, and without rebound.   Neurologic:  Alert and  oriented x4;  grossly normal neurologically.  Impression/Plan: Shelly Tyler is here for an colonoscopy to be performed for surveillance due to prior history of colon polyps   Risks, benefits, limitations, and alternatives regarding  colonoscopy have been reviewed with the patient.  Questions have been answered.  All parties agreeable.   [name], MD  04/23/2022, 7:47 AM

## 2022-04-23 NOTE — Op Note (Signed)
[facility] Gastroenterology Patient Name: Shelly Tyler Procedure Date: 04/23/2022 8:11 AM MRN: [id number] Account #: [id number] Date of Birth: Mar 18, 1965 Admit Type: Outpatient Age: 57 Room: [facility] ENDO ROOM 2 Gender: Female Note Status: Finalized Instrument Name: Prentice Docker [id number] Procedure:             Colonoscopy Indications:           Surveillance: Personal history of adenomatous polyps                         on last colonoscopy > 5 years ago Providers:             [name] MD, MD Referring MD:          Dorie Rank. Harvest Dark (Referring MD) Medicines:             Monitored Anesthesia Care Complications:         No immediate complications. Procedure:             Pre-Anesthesia Assessment:                        - Prior to the procedure, a History and Physical was                         performed, and patient medications, allergies and                         sensitivities were reviewed. The patient's tolerance                         of previous anesthesia was reviewed.                        - The risks and benefits of the procedure and the                         sedation options and risks were discussed with the                         patient. All questions were answered and informed                         consent was obtained.                        - After reviewing the risks and benefits, the patient                         was deemed in satisfactory condition to undergo the                         procedure.                        - ASA Grade Assessment: II - A patient with mild                         systemic disease.                        After obtaining informed consent,  the colonoscope was                         passed under direct vision. Throughout the procedure,                         the patient's blood pressure, pulse, and oxygen                         saturations were monitored continuously. The                         Colonoscope was  introduced through the anus and                         advanced to the the cecum, identified by the                         appendiceal orifice. The colonoscopy was performed                         with ease. The patient tolerated the procedure well.                         The quality of the bowel preparation was excellent.                         The ileocecal valve, appendiceal orifice, and rectum                         were photographed. Findings:      The perianal and digital rectal examinations were normal.      Two sessile polyps were found in the sigmoid colon and cecum. The polyps       were 3 to 4 mm in size. These polyps were removed with a cold biopsy       forceps. Resection and retrieval were complete.      The exam was otherwise without abnormality on direct and retroflexion       views. Impression:            - Two 3 to 4 mm polyps in the sigmoid colon and in the                         cecum, removed with a cold biopsy forceps. Resected                         and retrieved.                        - The examination was otherwise normal on direct and                         retroflexion views. Recommendation:        - Discharge patient to home (with escort).                        - Resume previous diet.                        -  Continue present medications.                        - Await pathology results.                        - Repeat colonoscopy for surveillance based on                         pathology results. Procedure Code(s):     --- Professional ---                        [phone], Colonoscopy, flexible; with biopsy, single or                         multiple Diagnosis Code(s):     --- Professional ---                        Z86.010, Personal history of colonic polyps                        D12.5, Benign neoplasm of sigmoid colon                        D12.0, Benign neoplasm of cecum CPT copyright 2022 American Medical Association. All rights reserved. The  codes documented in this report are preliminary and upon coder review may  be revised to meet current compliance requirements. [name], MD [name] MD, MD 04/23/2022 8:44:04 AM This report has been signed electronically. Number of Addenda: 0 Note Initiated On: 04/23/2022 8:11 AM Scope Withdrawal Time: 0 hours 12 minutes 17 seconds  Total Procedure Duration: 0 hours 15 minutes 33 seconds  Estimated Blood Loss:  Estimated blood loss: none.      [facility]

## 2022-04-23 NOTE — Transfer of Care (Signed)
Immediate Anesthesia Transfer of Care Note  Patient: Shelly Tyler  Procedure(s) Performed: COLONOSCOPY WITH PROPOFOL  Patient Location: PACU and Endoscopy Unit  Anesthesia Type:General  Level of Consciousness: drowsy and patient cooperative  Airway & Oxygen Therapy: Patient Spontanous Breathing  Post-op Assessment: Report given to RN and Post -op Vital signs reviewed and stable  Post vital signs: Reviewed and stable  Last Vitals:  Vitals Value Taken Time  BP 105/70 04/23/22 0843  Temp 35.7 C 04/23/22 0843  Pulse 67 04/23/22 0846  Resp 17 04/23/22 0846  SpO2 100 % 04/23/22 0846  Vitals shown include unvalidated device data.  Last Pain:  Vitals:   04/23/22 0843  TempSrc: Temporal  PainSc: Asleep         Complications: No notable events documented.

## 2022-04-24 ENCOUNTER — Encounter: Payer: Self-pay | Admitting: Gastroenterology

## 2022-04-24 ENCOUNTER — Ambulatory Visit: Payer: BC Managed Care – PPO | Admitting: Nurse Practitioner

## 2022-04-25 LAB — SURGICAL PATHOLOGY

## 2022-04-30 ENCOUNTER — Encounter: Payer: Self-pay | Admitting: Nurse Practitioner

## 2022-05-12 ENCOUNTER — Emergency Department
Admission: EM | Admit: 2022-05-12 | Discharge: 2022-05-12 | Disposition: A | Discharge status: Home / Self Care | Payer: BC Managed Care – PPO | Attending: Emergency Medicine | Admitting: Emergency Medicine

## 2022-05-12 ENCOUNTER — Emergency Department: Payer: BC Managed Care – PPO

## 2022-05-12 DIAGNOSIS — L03116 Cellulitis of left lower limb: Secondary | ICD-10-CM | POA: Diagnosis not present

## 2022-05-12 DIAGNOSIS — M79662 Pain in left lower leg: Secondary | ICD-10-CM | POA: Diagnosis present

## 2022-05-12 MED ORDER — CEPHALEXIN 500 MG PO CAPS: 500.0000 mg | ORAL_CAPSULE | Freq: Four times a day (QID) | ORAL | 0 refills | Status: AC

## 2022-05-12 NOTE — Discharge Instructions (Signed)
Follow-up with your regular doctor if not improving 3 days.  Return if worsening.  Take medication as prescribed 

## 2022-05-12 NOTE — ED Provider Notes (Signed)
[facility] Provider Note    Event Date/Time   First MD Initiated Contact with Patient 05/12/22 1353     (approximate)   History   Leg Pain   HPI  Shelly Tyler is a 57 y.o. female with history of OCD, migraines and tobacco use presents emergency department with redness on the left lower leg.  States noticed today after church.  Was not there prior to church.  No calf pain no chest pain or shortness of breath.  Does work in her garden and there is an abrasion on the lower leg.      Physical Exam   Triage Vital Signs: ED Triage Vitals  Enc Vitals Group     BP 05/12/22 1259 105/66     Pulse Rate 05/12/22 1259 82     Resp 05/12/22 1259 18     Temp 05/12/22 1259 99.3 F (37.4 C)     Temp Source 05/12/22 1259 Oral     SpO2 05/12/22 1259 100 %     Weight 05/12/22 1301 130 lb (59 kg)     Height 05/12/22 1301 5\' 5"  (1.651 m)     Head Circumference --      Peak Flow --      Pain Score 05/12/22 1301 3     Pain Loc --      Pain Edu? --      Excl. in GC? --     Most recent vital signs: Vitals:   05/12/22 1259  BP: 105/66  Pulse: 82  Resp: 18  Temp: 99.3 F (37.4 C)  SpO2: 100%     General: Awake, no distress.   CV:  Good peripheral perfusion. regular rate and  rhythm Resp:  Normal effort. Lungs cta Abd:  No distention.   Other:  Left lower extremity with anterior redness and tenderness, no calf tenderness, neurovascular is intact   ED Results / Procedures / Treatments   Labs (all labs ordered are listed, but only abnormal results are displayed) Labs Reviewed - No data to display   EKG     RADIOLOGY X-ray of the left tib-fib    PROCEDURES:   Procedures   MEDICATIONS ORDERED IN ED: Medications - No data to display   IMPRESSION / MDM / ASSESSMENT AND PLAN / ED COURSE  I reviewed the triage vital signs and the nursing notes.                              Differential diagnosis includes, but is not limited to,  cellulitis, osteomyelitis, abrasion, DVT  Patient's presentation is most consistent with acute complicated illness / injury requiring diagnostic workup.   I do not feel the patient has a DVT, her calf is not tender there is only redness on the anterior shin, there is an abrasion noted at the anterior shin also  Did explain all findings to the patient.  We did a x-ray to assess bony integrity, x-ray of the left tib-fib was independently reviewed and interpreted by me as being negative for any acute abnormality  Patient was placed on Keflex 500 4 times daily.  She is to follow-up with her regular doctor if not improving to 3 days.  Return emergency department worsening.  Patient is in agreement treatment plan.  Discharged stable condition.      FINAL CLINICAL IMPRESSION(S) / ED DIAGNOSES   Final diagnoses:  Cellulitis of left lower extremity  Rx / DC Orders   ED Discharge Orders          Ordered    cephALEXin (KEFLEX) 500 MG capsule  4 times daily        05/12/22 1456             Note:  This document was prepared using Dragon voice recognition software and may include unintentional dictation errors.    Faythe Ghee, PA-C 05/12/22 1545    Concha Se, MD 05/12/22 [phone]

## 2022-05-12 NOTE — ED Triage Notes (Signed)
Pt ambulatory to triage with a steady gait.. Pt reports LLE redness. Pt reports chills. Denies trauma. Pt states that it wasn't there when she was getting ready for church but after church noticed the redness and pain below her knee.

## 2022-05-14 ENCOUNTER — Telehealth: Payer: Self-pay

## 2022-05-14 NOTE — Transitions of Care (Post Inpatient/ED Visit) (Signed)
   05/14/2022  Name: Shelly Tyler MRN: [id number] DOB: 1965/08/20  Today's TOC FU Call Status: Today's TOC FU Call Status:: Successful TOC FU Call Competed TOC FU Call Complete Date: 05/14/22  Transition Care Management Follow-up Telephone Call Date of Discharge: 05/12/22 Discharge Facility: [facility]) Type of Discharge: Emergency Department Reason for ED Visit: Other: How have you been since you were released from the hospital?: Better Any questions or concerns?: No  Items Reviewed: Did you receive and understand the discharge instructions provided?: Yes Medications obtained,verified, and reconciled?: Yes (Medications Reviewed) Any new allergies since your discharge?: No Dietary orders reviewed?: No Do you have support at home?: No  Medications Reviewed Today: Medications Reviewed Today     Reviewed by Hennie Duos, RN (Registered Nurse) on 04/23/22 at [phone]  Med List Status: <None>    acetaminophen (TYLENOL) 325 MG tablet   Order: [id number] Taking?: No Sig: Take by mouth. Documenting Provider: [provider] Last Dose: prn Status: Active Informant: Not Known Med Note: Elton Sin   Fri Sep 03, 2019  9:26 AM) As needed        Ascorbic Acid (VITAMIN C PO)   Order: [id number] Taking?: Yes Sig: Take by mouth daily. Documenting Provider: [provider] Last Dose: Past Week Status: Active Informant: Self        BIOTIN PO   Order: [id number] Taking?: Yes Sig: Take by mouth daily. Documenting Provider: [provider] Last Dose: Past Week Status: Active Informant: Self        buPROPion (WELLBUTRIN SR) 150 MG 12 hr tablet   Order: [id number] Taking?: Not Known Sig: START WITH 150 MG (1 TABLET) ONCE A DAY BY MOUTH FOR 3 DAYS AND THEN INCREASE TO 150 MG (1 TABLET) TWICE A DAY BY MOUTH. Patient not taking: Reported on 04/02/2022 Documenting Provider: Marjie Skiff, NP Last Dose: None Status:  Active Informant: Not Known        Multiple Vitamins-Minerals (ZINC PO)   Order: [id number] Taking?: Yes Sig: Take by mouth daily. Documenting Provider: [provider] Last Dose: Past Week Status: Active Informant: Self                Home Care and Equipment/Supplies: Were Home Health Services Ordered?: No Any new equipment or medical supplies ordered?: No  Functional Questionnaire: Do you need assistance with bathing/showering or dressing?: No Do you need assistance with meal preparation?: No Do you need assistance with eating?: No Do you have difficulty maintaining continence: No Do you need assistance with getting out of bed/getting out of a chair/moving?: No Do you have difficulty managing or taking your medications?: No  Follow up appointments reviewed: PCP Follow-up appointment confirmed?: Yes MD Provider Line Number:[phone] Given: Yes Date of PCP follow-up appointment?: 05/17/22 Follow-up Provider: Aura [facility] Specialist Hospital Follow-up appointment confirmed?: No Reason Specialist Follow-Up Not Confirmed: Appointment Sceduled by [facility] Calling Clinician Do you need transportation to your follow-up appointment?: No Do you understand care options if your condition(s) worsen?: Yes-patient verbalized understanding    SIGNATURE Leward Quan, [facility]

## 2022-05-15 NOTE — Patient Instructions (Signed)
Placed new referral to endocrinology, we need him seen more frequently and you all need assist with pump placement.  Cellulitis, Adult  Cellulitis is a skin infection. The infected area is often warm, red, swollen, and sore. It occurs most often on the legs, feet, and toes, but can happen on any part of the body. This condition can be life-threatening without treatment. It is very important to get treated right away. What are the causes? This condition is caused by bacteria. The bacteria enter through a break in the skin, such as: A cut. A burn. A bug bite. An animal bite. An open sore. A crack. What increases the risk? Having a weak body's defense system (immune system). Being older than 60 years old. Having a blood sugar problem (diabetes). Having a long-term liver disease (cirrhosis) or kidney disease. Being very overweight (obese). Having a skin problem, such as: An itchy rash. A rash caused by a fungus. A rash with blisters. Slow movement of blood in the veins (venous stasis). Fluid buildup below the skin (edema). This condition is more likely to occur in people who: Have open cuts, burns, bites, or scrapes on the skin. Have been treated with high-energy rays (radiation). Use IV drugs. What are the signs or symptoms? Skin that: Looks red or purple, or slightly darker than your usual skin color. Has streaks. Has spots. Is swollen. Is sore or painful when you touch it. Is warm. A fever. Chills. Blisters. Tiredness (fatigue). How is this treated? Medicines to treat infections or allergies. Rest. Placing cold or warm cloths on the skin. Staying in the hospital, if the condition is very bad. You may need medicines through an IV. Follow these instructions at home: Medicines Take over-the-counter and prescription medicines only as told by your doctor. If you were prescribed antibiotics, take them as told by your doctor. Do not stop using them even if you start to feel  better. General instructions Drink enough fluid to keep your pee (urine) pale yellow. Do not touch or rub the infected area. Raise (elevate) the infected area above the level of your heart while you are sitting or lying down. Return to your normal activities when your doctor says that it is safe. Place cold or warm cloths on the area as told by your doctor. Keep all follow-up visits. Your doctor will need to make sure that a more serious infection is not developing. Contact a doctor if: You have a fever. You do not start to get better after 1-2 days of treatment. Your bone or joint under the infected area starts to hurt after the skin has healed. Your infection comes back in the same area or another area. Signs of this may include: You have a swollen bump in the area. Your red area gets larger, turns dark in color, or hurts more. You have more fluid coming from the wound. Pus or a bad smell develops in your infected area. You have more pain. You feel sick and have muscle aches and weakness. You develop vomiting or watery poop that will not go away. Get help right away if: You see red streaks coming from the area. You notice the skin turns purple or black and falls off. These symptoms may be an emergency. Get help right away. Call 911. Do not wait to see if the symptoms will go away. Do not drive yourself to the hospital. This information is not intended to replace advice given to you by your health care provider. Make sure you   discuss any questions you have with your health care provider. Document Revised: 08/21/2021 Document Reviewed: 08/21/2021 Elsevier Patient Education  2023 Elsevier Inc.  

## 2022-05-17 ENCOUNTER — Encounter: Payer: Self-pay | Admitting: Nurse Practitioner

## 2022-05-17 ENCOUNTER — Ambulatory Visit: Payer: BC Managed Care – PPO | Admitting: Nurse Practitioner

## 2022-05-17 VITALS — BP 128/84 | HR 65 | Temp 98.1°F | Ht 64.02 in | Wt 132.5 lb

## 2022-05-17 DIAGNOSIS — B379 Candidiasis, unspecified: Secondary | ICD-10-CM

## 2022-05-17 DIAGNOSIS — R8281 Pyuria: Secondary | ICD-10-CM

## 2022-05-17 DIAGNOSIS — L03116 Cellulitis of left lower limb: Secondary | ICD-10-CM | POA: Diagnosis not present

## 2022-05-17 DIAGNOSIS — R399 Unspecified symptoms and signs involving the genitourinary system: Secondary | ICD-10-CM | POA: Diagnosis not present

## 2022-05-17 LAB — URINALYSIS, ROUTINE W REFLEX MICROSCOPIC
Bilirubin, UA: NEGATIVE
Glucose, UA: NEGATIVE
Ketones, UA: NEGATIVE
Nitrite, UA: NEGATIVE
Protein,UA: NEGATIVE
Specific Gravity, UA: 1.015 (ref 1.005–1.030)
Urobilinogen, Ur: 0.2 mg/dL (ref 0.2–1.0)
pH, UA: 7 (ref 5.0–7.5)

## 2022-05-17 LAB — WET PREP FOR TRICH, YEAST, CLUE
Clue Cell Exam: NEGATIVE
Trichomonas Exam: NEGATIVE
Yeast Exam: POSITIVE — AB

## 2022-05-17 LAB — MICROSCOPIC EXAMINATION: Bacteria, UA: NONE SEEN

## 2022-05-17 MED ORDER — FLUCONAZOLE 150 MG PO TABS: 150.0000 mg | ORAL_TABLET | Freq: Once | ORAL | 0 refills | Status: AC

## 2022-05-17 MED ORDER — BUPROPION HCL ER (SR) 150 MG PO TB12: 150.0000 mg | ORAL_TABLET | Freq: Two times a day (BID) | ORAL | 2 refills | Status: AC

## 2022-05-17 NOTE — Assessment & Plan Note (Signed)
Acute for several days  Wet prep + yeast.  UA negative nitrites, positive Leuks.  Will send for culture.  Start Diflucan.  Will change regimen as needed based on urine culture.  Educated on regimen and how to take.  Can take probiotic with these to ease stomach symptoms.

## 2022-05-17 NOTE — Progress Notes (Signed)
Contacted via MyChart   Do not appear to have urine infection, but I did send for culture to ensure.:)

## 2022-05-17 NOTE — Progress Notes (Signed)
BP 128/84   Pulse 65   Temp 98.1 F (36.7 C) (Oral)   Ht 5' 4.02" (1.626 m)   Wt 132 lb 8 oz (60.1 kg)   SpO2 98%   BMI 22.73 kg/m    Subjective:    Patient ID: Shelly Tyler, female    DOB: [birth year], 57 y.o.   MRN: [id number]  HPI: Shelly Tyler is a 57 y.o. female  Chief Complaint  Patient presents with   Cellulitis    Was seen in ED for on 5/5. Patient states that the swelling is much better and has resolved.    Urinary Frequency    Wants tested for UTI and BV just because she is here.   SKIN INFECTION Seen in ER on 05/11/12 and started on treatment for cellulitis left lower leg with Keflex.  She is unsure what happened -- imaging done in ER was reassuring. Is feeling better, still continues on abx therapy -- for total of 10 days.  Duration: days Location: left lower leg History of trauma in area: no Pain: no Redness: no Swelling: no Oozing: no Pus: no Fevers:  initially had fever a couple times Nausea/vomiting: no Status: better Treatments attempted:antibiotics  Tetanus: UTD   URINARY SYMPTOMS Has been having some frequency.  Over last few days.  Last BV infection treated on 04/02/22. Dysuria: no Urinary frequency: yes Urgency: yes Small volume voids: no Symptom severity: no Urinary incontinence: no Foul odor: yes Hematuria: no Abdominal pain: no Back pain:  occasional Suprapubic pain/pressure: no Flank pain: no Fever:  yes due to skin infection Vomiting: no Status: stable Previous urinary tract infection: yes Recurrent urinary tract infection: no Sexual activity: monogamous History of sexually transmitted disease: no Treatments attempted: increasing fluids    Relevant past medical, surgical, family and social history reviewed and updated as indicated. Interim medical history since our last visit reviewed. Allergies and medications reviewed and updated.  Review of Systems  Constitutional:  Negative for activity change, appetite change,  diaphoresis, fatigue and fever.  Respiratory:  Negative for cough, chest tightness and shortness of breath.   Cardiovascular:  Negative for chest pain, palpitations and leg swelling.  Gastrointestinal: Negative.   Genitourinary:  Positive for frequency and urgency. Negative for dysuria, flank pain, hematuria, vaginal bleeding, vaginal discharge and vaginal pain.  Musculoskeletal:  Negative for back pain.  Neurological: Negative.   Psychiatric/Behavioral: Negative.      Per HPI unless specifically indicated above     Objective:    BP 128/84   Pulse 65   Temp 98.1 F (36.7 C) (Oral)   Ht 5' 4.02" (1.626 m)   Wt 132 lb 8 oz (60.1 kg)   SpO2 98%   BMI 22.73 kg/m   Wt Readings from Last 3 Encounters:  05/17/22 132 lb 8 oz (60.1 kg)  05/12/22 130 lb (59 kg)  04/23/22 129 lb 4.1 oz (58.6 kg)    Physical Exam Vitals and nursing note reviewed.  Constitutional:      General: She is awake. She is not in acute distress.    Appearance: She is well-developed and well-groomed. She is not ill-appearing or toxic-appearing.  HENT:     Head: Normocephalic.     Right Ear: Hearing and external ear normal.     Left Ear: Hearing and external ear normal.  Eyes:     General: Lids are normal.        Right eye: No discharge.  Left eye: No discharge.     Conjunctiva/sclera: Conjunctivae normal.     Pupils: Pupils are equal, round, and reactive to light.  Neck:     Thyroid: No thyromegaly.     Vascular: No carotid bruit or JVD.  Cardiovascular:     Rate and Rhythm: Normal rate and regular rhythm.     Heart sounds: Normal heart sounds. No murmur heard.    No gallop.  Pulmonary:     Effort: Pulmonary effort is normal. No accessory muscle usage or respiratory distress.     Breath sounds: Normal breath sounds.  Abdominal:     General: Bowel sounds are normal. There is no distension.     Palpations: Abdomen is soft.     Tenderness: There is no abdominal tenderness. There is no right CVA  tenderness or left CVA tenderness.  Musculoskeletal:     Cervical back: Normal range of motion and neck supple.     Right lower leg: No edema.     Left lower leg: No edema.  Lymphadenopathy:     Cervical: No cervical adenopathy.  Skin:    General: Skin is warm and dry.       Neurological:     Mental Status: She is alert and oriented to person, place, and time.  Psychiatric:        Attention and Perception: Attention normal.        Mood and Affect: Mood normal.        Speech: Speech normal.        Behavior: Behavior normal. Behavior is cooperative.        Thought Content: Thought content normal.     Results for orders placed or performed during the hospital encounter of 04/23/22  Surgical pathology  Result Value Ref Range   SURGICAL PATHOLOGY      SURGICAL PATHOLOGY CASE: [id number] PATIENT: Shelly Tyler Surgical Pathology Report     Specimen Submitted: A. Colon polyp, cecum; cbx B. Colon polyp, sigmoid; cbx  Clinical History: Screening colonoscopy.  Polyps      DIAGNOSIS: A.  COLON POLYP, CECUM; COLD BIOPSY: - SUPERFICIAL FRAGMENTS OF SMALL INTESTINAL VILLI AND SEPARATE FRAGMENT OF COLONIC MUCOSA WITH LYMPHOID AGGREGATE AND FOCAL HYPERPLASTIC EPITHELIAL CHANGE. - DETACHED LYMPHOID AGGREGATE. - NEGATIVE FOR DYSPLASIA AND MALIGNANCY.  B.  COLON POLYP, SIGMOID; COLD BIOPSY: - HYPERPLASTIC POLYP. - NEGATIVE FOR DYSPLASIA AND MALIGNANCY.  GROSS DESCRIPTION: A. Labeled: cbx cecal polyp Received: Formalin Collection time: 8:29 AM on 04/23/2022 Placed into formalin time: 8:29 AM on 04/23/2022 Tissue fragment(s): 2 Size: Range from 0.2-0.5 cm Description: Received is a fragment of tan-pink soft tissue and a fragment of intestinal debris.  The ratio of soft tissue to intestinal debris is 80: 20. E ntirely submitted in 1 cassette.  B. Labeled: cbx sigmoid colon polyp Received: Formalin Collection time: 8:39 AM on 04/23/2022 Placed into formalin time: 8:39  AM on 04/23/2022 Tissue fragment(s): 2 Size: Each 0.4 cm Description: Tan-pink soft tissue fragments Entirely submitted in 1 cassette.  RB 04/23/2022  Final Diagnosis performed by Elijah Birk, MD.   Electronically signed 04/25/2022 8:31:53AM The electronic signature indicates that the named Attending Pathologist has evaluated the specimen Technical component performed at [facility], [address] [place], [place] [address] Lab: [phone] Dir: Kerby Hockley Schimke, MD, MMM  Professional component performed at [facility], [facility], [address] [place], [place], [place] [address] Lab: [phone] Dir: Beryle Quant, MD       Assessment & Plan:   Problem  List Items Addressed This Visit       Other   Urinary symptom or sign - Primary    Acute for several days  Wet prep + yeast.  UA negative nitrites, positive Leuks.  Will send for culture.  Start Diflucan.  Will change regimen as needed based on urine culture.  Educated on regimen and how to take.  Can take probiotic with these to ease stomach symptoms.      Relevant Orders   Urinalysis, Routine w reflex microscopic   WET PREP FOR TRICH, YEAST, CLUE   Yeast infection    Acute, suspect related to abx use.  Sent in Diflucan and discussed with patient.      Relevant Medications   fluconazole (DIFLUCAN) 150 MG tablet   Other Visit Diagnoses     Cellulitis of left leg       Acute and improved at this time, complete full abx course.   Pyuria       Urine sent for culture   Relevant Orders   Urine Culture        Follow up plan: Return if symptoms worsen or fail to improve.

## 2022-05-17 NOTE — Assessment & Plan Note (Signed)
Acute, suspect related to abx use.  Sent in Diflucan and discussed with patient.

## 2022-05-20 LAB — URINE CULTURE

## 2022-05-20 NOTE — Progress Notes (Signed)
Contacted via MyChart   No infection noted in urine:)

## 2022-05-24 ENCOUNTER — Encounter: Payer: Self-pay | Admitting: Gastroenterology

## 2023-04-25 ENCOUNTER — Other Ambulatory Visit: Payer: Self-pay | Admitting: Nurse Practitioner

## 2023-04-25 DIAGNOSIS — Z1231 Encounter for screening mammogram for malignant neoplasm of breast: Secondary | ICD-10-CM

## 2023-04-28 ENCOUNTER — Other Ambulatory Visit: Payer: Self-pay | Admitting: Emergency Medicine

## 2023-04-28 DIAGNOSIS — Z122 Encounter for screening for malignant neoplasm of respiratory organs: Secondary | ICD-10-CM

## 2023-04-28 DIAGNOSIS — F17210 Nicotine dependence, cigarettes, uncomplicated: Secondary | ICD-10-CM

## 2023-04-28 DIAGNOSIS — Z87891 Personal history of nicotine dependence: Secondary | ICD-10-CM

## 2023-05-05 ENCOUNTER — Ambulatory Visit
Admission: RE | Admit: 2023-05-05 | Discharge: 2023-05-05 | Disposition: A | Discharge status: Home / Self Care | Source: Ambulatory Visit | Attending: Nurse Practitioner | Admitting: Nurse Practitioner

## 2023-05-05 ENCOUNTER — Ambulatory Visit
Admission: RE | Admit: 2023-05-05 | Discharge: 2023-05-05 | Disposition: A | Discharge status: Home / Self Care | Source: Ambulatory Visit | Attending: Acute Care | Admitting: Acute Care

## 2023-05-05 DIAGNOSIS — F17210 Nicotine dependence, cigarettes, uncomplicated: Secondary | ICD-10-CM | POA: Insufficient documentation

## 2023-05-05 DIAGNOSIS — Z87891 Personal history of nicotine dependence: Secondary | ICD-10-CM | POA: Insufficient documentation

## 2023-05-05 DIAGNOSIS — Z122 Encounter for screening for malignant neoplasm of respiratory organs: Secondary | ICD-10-CM | POA: Insufficient documentation

## 2023-05-05 DIAGNOSIS — Z1231 Encounter for screening mammogram for malignant neoplasm of breast: Secondary | ICD-10-CM | POA: Insufficient documentation

## 2023-05-07 ENCOUNTER — Encounter: Payer: Self-pay | Admitting: Nurse Practitioner

## 2023-05-07 NOTE — Progress Notes (Signed)
 Contacted via MyChart   Normal mammogram, may repeat in one year:)

## 2023-05-28 ENCOUNTER — Other Ambulatory Visit: Payer: Self-pay | Admitting: Acute Care

## 2023-05-28 DIAGNOSIS — F17210 Nicotine dependence, cigarettes, uncomplicated: Secondary | ICD-10-CM

## 2023-05-28 DIAGNOSIS — Z87891 Personal history of nicotine dependence: Secondary | ICD-10-CM

## 2023-05-28 DIAGNOSIS — Z122 Encounter for screening for malignant neoplasm of respiratory organs: Secondary | ICD-10-CM

## 2023-11-24 ENCOUNTER — Encounter: Payer: Self-pay | Admitting: Nurse Practitioner

## 2024-01-09 ENCOUNTER — Ambulatory Visit: Admitting: Nurse Practitioner

## 2024-02-02 ENCOUNTER — Encounter: Payer: Self-pay | Admitting: Emergency Medicine

## 2024-02-02 ENCOUNTER — Encounter

## 2024-02-02 ENCOUNTER — Ambulatory Visit
Admission: EM | Admit: 2024-02-02 | Discharge: 2024-02-02 | Disposition: A | Discharge status: Home / Self Care | Source: Ambulatory Visit | Attending: Emergency Medicine | Admitting: Emergency Medicine

## 2024-02-02 ENCOUNTER — Ambulatory Visit: Payer: Self-pay

## 2024-02-02 DIAGNOSIS — N12 Tubulo-interstitial nephritis, not specified as acute or chronic: Secondary | ICD-10-CM | POA: Insufficient documentation

## 2024-02-02 DIAGNOSIS — R399 Unspecified symptoms and signs involving the genitourinary system: Secondary | ICD-10-CM | POA: Insufficient documentation

## 2024-02-02 LAB — POCT URINE DIPSTICK
Glucose, UA: NEGATIVE mg/dL
Ketones, POC UA: NEGATIVE mg/dL
Nitrite, UA: POSITIVE — AB
Protein Ur, POC: 30 mg/dL — AB
Spec Grav, UA: 1.025
Urobilinogen, UA: 0.2 U/dL
pH, UA: 5.5

## 2024-02-02 MED ORDER — CEFDINIR 300 MG PO CAPS: 300.0000 mg | ORAL_CAPSULE | Freq: Two times a day (BID) | ORAL | 0 refills | Status: AC

## 2024-02-02 MED ORDER — TAMSULOSIN HCL 0.4 MG PO CAPS: 0.4000 mg | ORAL_CAPSULE | Freq: Every day | ORAL | 0 refills | Status: AC

## 2024-02-02 MED ORDER — IBUPROFEN 600 MG PO TABS: 600.0000 mg | ORAL_TABLET | Freq: Four times a day (QID) | ORAL | 0 refills | Status: AC | PRN

## 2024-02-02 MED ORDER — ONDANSETRON 8 MG PO TBDP: ORAL_TABLET | ORAL | 0 refills | Status: AC

## 2024-02-02 MED ORDER — CEFTRIAXONE SODIUM 1 G IJ SOLR
1.0000 g | Freq: Once | INTRAMUSCULAR | Status: AC
  Administered 2024-02-02: 1 g via INTRAMUSCULAR

## 2024-02-02 NOTE — Telephone Encounter (Signed)
 FYI Only or Action Required?: FYI only for provider: UC today.  Patient was last seen in primary care on 05/17/2022 by Valerio Melanie DASEN, NP.  Called Nurse Triage reporting Back Pain and Fatigue.  Symptoms began yesterday.  Interventions attempted: OTC medications: Tylenol  and Rest, hydration, or home remedies.  Symptoms are: gradually worsening.  Triage Disposition: See Physician Within 24 Hours  Patient/caregiver understands and will follow disposition?: Yes  Message from Delon HERO sent at 02/02/2024  9:03 AM EST  Summary: Extreme fatigue, body aches, right side back pain   Reason for Triage: Patient is calling to report right side back pain started yesterday, with extreme fatigue, body aches - whole is aching, temperature yesterday 100.0. Today normal 6:00a, Unable to eat since Saturday- No appetite, No nausea, some dizziness yesterday         Reason for Disposition  [1] Fever AND [2] no symptoms of UTI  (Exception: Has generalized muscle pains, not localized back pain.)  Answer Assessment - Initial Assessment Questions Woke yesterday with right sided back pain, fatigue, and generalized weakness. She slept/lay down most of the day to rest. Fever with Tmax 100. Nothing so far today. Tylenol - mild relief. Nausea but no vomiting.  Denies CP, SOB, Dizziness  Has had a UTI in the past with no symptoms. Thinking this might be UTI. Advised on office hours and UC/ED availability.  Patient to go to the UC at [facility] today for assessment.   1. ONSET: When did the pain begin? (e.g., minutes, hours, days)     yesterday 2. LOCATION: Where does it hurt? (upper, mid or lower back)     Right back between rib and hip bone 3. SEVERITY: How bad is the pain?  (e.g., Scale 1-10; mild, moderate, or severe)     4-5/10 4. PATTERN: Is the pain constant? (e.g., yes, no; constant, intermittent)      Pulling when gets up -pulling/sore 5. RADIATION: Does the pain shoot into your legs or  somewhere else?     Denies  6. CAUSE:  What do you think is causing the back pain?      Unsure if yeast 7. BACK OVERUSE:  Any recent lifting of heavy objects, strenuous work or exercise?     denies 8. MEDICINES: What have you taken so far for the pain? (e.g., nothing, acetaminophen , NSAIDS)     tylenol  9. NEUROLOGIC SYMPTOMS: Do you have any weakness, numbness, or problems with bowel/bladder control?     Denies  10. OTHER SYMPTOMS: Do you have any other symptoms? (e.g., fever, abdomen pain, burning with urination, blood in urine)       Fatigue, gen weakness, temp  Protocols used: Back Pain-A-AH

## 2024-02-02 NOTE — Discharge Instructions (Signed)
 I have sent your urine off for culture to make sure that we have you on the correct antibiotic.  We have given you 1000 g of Rocephin  IM here today start the ball rolling.  Finish Omnicef , even if you feel better.  Zofran  for nausea.  Ibuprofen  combined with 1000 mg Tylenol  together 3-4 times a day as needed for pain.  Push extra electrolyte containing fluids as we discussed.  Flomax  will help you pass the stone if you indeed have a stone.

## 2024-02-02 NOTE — ED Provider Notes (Signed)
 HPI  SUBJECTIVE:  Shelly Tyler is a 59 y.o. female who presents with bodyaches and constant right sided back pain that radiates to her flank accompanied with fevers Tmax 100 and nausea starting yesterday.  She has no urinary or respiratory complaints.  No vomiting.  She states this feels like previous UTI/pyelonephritis.  No antibiotics in the past month.  No antipyretic in the past 6 hours.  She tried Tylenol  and increasing fluids without improvement in her symptoms.  No aggravating factors.  She has a past medical history of UTI, pyelonephritis, nonobstructing nephrolithiasis per chart review.  Patient denies history of nephrolithiasis.  She also has a past medical history of emphysema.  PCP: [facility].   Past Medical History:  Diagnosis Date   Anxiety    Depression    Migraine    OCD (obsessive compulsive disorder)    Tobacco abuse     Past Surgical History:  Procedure Laterality Date   BREAST BIOPSY Right 2011   neg   BUNIONECTOMY     CESAREAN SECTION     COLONOSCOPY WITH PROPOFOL  N/A 03/10/2017   Procedure: COLONOSCOPY WITH PROPOFOL ;  Surgeon: Therisa Bi, MD;  Location: [facility] ENDOSCOPY;  Service: Gastroenterology;  Laterality: N/A;   COLONOSCOPY WITH PROPOFOL  N/A 04/23/2022   Procedure: COLONOSCOPY WITH PROPOFOL ;  Surgeon: Therisa Bi, MD;  Location: [facility] ENDOSCOPY;  Service: Gastroenterology;  Laterality: N/A;   WISDOM TOOTH EXTRACTION      Family History  Problem Relation Age of Onset   Diabetes type II Mother    Cancer Mother        lung   Diabetes Mother    Hypertension Mother    Osteoporosis Mother    Alcohol abuse Father    Cancer Paternal Grandmother        lung   Alzheimer's disease Maternal Grandmother    Breast cancer Maternal Aunt    Breast cancer Maternal Aunt     Social History[1]  Current Medications[2]  Allergies[3]   ROS  As noted in HPI.   Physical Exam  BP 104/68 (BP Location: Right Arm)   Pulse 87   Temp 99.6 F (37.6  C) (Oral)   Resp 20   SpO2 95%   Constitutional: Well developed, well nourished, no acute distress Eyes:  EOMI, conjunctiva normal bilaterally HENT: Normocephalic, atraumatic,mucus membranes moist Respiratory: Normal inspiratory effort Cardiovascular: Normal rate GI: nondistended.  Positive right flank tenderness.  No suprapubic tenderness.  Negative Murphy, negative McBurney.  Active bowel sounds.  No rebound, guarding. Back: Right CVAT.  No paralumbar, L-spine tenderness. skin: No rash, skin intact Musculoskeletal: no deformities Neurologic: Alert & oriented x 3, no focal neuro deficits Psychiatric: Speech and behavior appropriate   ED Course   Medications  cefTRIAXone  (ROCEPHIN ) injection 1 g (has no administration in time range)    Orders Placed This Encounter  Procedures   Urine Culture    Standing Status:   Standing    Number of Occurrences:   1    Indication:   Flank Pain   POCT URINE DIPSTICK    Standing Status:   Standing    Number of Occurrences:   1    Results for orders placed or performed during the hospital encounter of 02/02/24 (from the past 24 hours)  POCT URINE DIPSTICK     Status: Abnormal   Collection Time: 02/02/24  1:04 PM  Result Value Ref Range   Color, UA orange (A) yellow   Clarity, UA cloudy (A)  clear   Glucose, UA negative negative mg/dL   Bilirubin, UA small (A) negative   Ketones, POC UA negative negative mg/dL   Spec Grav, UA 8.974 8.989 - 1.025   Blood, UA large (A) negative   pH, UA 5.5 5.0 - 8.0   Protein Ur, POC =30 (A) negative mg/dL   Urobilinogen, UA 0.2 0.2 or 1.0 E.U./dL   Nitrite, UA Positive (A) Negative   Leukocytes, UA Trace (A) Negative   No results found.  ED Clinical Impression  1. Pyelonephritis   2. Urinary symptom or sign      ED Assessment/Plan    Previous labs reviewed.  Last urine culture in 2023 grew out E. coli that was sensitive to Augmentin , cephalosporins, Cipro , Macrobid , distant to doxycycline   and Bactrim .  UA consistent with a urinary tract infection.  Will send off for culture to confirm antibiotic choice.  Presentation concerning for pyelonephritis.  Giving Rocephin  1000 mg IM here.  Will send home with Omnicef  300 mg p.o. twice daily for 10 days.  Tylenol  combined with ibuprofen , Zofran .  Patient denies history of nephrolithiasis, but it is listed in her chart, she does not recall any obstructing nephrolithiasis, so we can try some Flomax .  We do not have imaging today to look for stones unfortunately.  Follow-up with PCP as needed.  Push electrolyte containing fluids.  Strict ER return precautions given.  Discussed labs,MDM, treatment plan, and plan for follow-up with patient. Discussed sn/sx that should prompt return to the ED. patient agrees with plan.   Meds ordered this encounter  Medications   cefTRIAXone  (ROCEPHIN ) injection 1 g   cefdinir  (OMNICEF ) 300 MG capsule    Sig: Take 1 capsule (300 mg total) by mouth 2 (two) times daily.    Dispense:  20 capsule    Refill:  0   ibuprofen  (ADVIL ) 600 MG tablet    Sig: Take 1 tablet (600 mg total) by mouth every 6 (six) hours as needed.    Dispense:  30 tablet    Refill:  0   ondansetron  (ZOFRAN -ODT) 8 MG disintegrating tablet    Sig: 1/2- 1 tablet q 8 hr prn nausea, vomiting    Dispense:  20 tablet    Refill:  0   tamsulosin  (FLOMAX ) 0.4 MG CAPS capsule    Sig: Take 1 capsule (0.4 mg total) by mouth at bedtime for 7 days.    Dispense:  7 capsule    Refill:  0      *This clinic note was created using Scientist, clinical (histocompatibility and immunogenetics). Therefore, there may be occasional mistakes despite careful proofreading.  ?     [1]  Social History Tobacco Use   Smoking status: Every Day    Current packs/day: 0.50    Average packs/day: 0.5 packs/day for 39.0 years (19.5 ttl pk-yrs)    Types: Cigarettes   Smokeless tobacco: Never  Vaping Use   Vaping status: Some Days  Substance Use Topics   Alcohol use: Yes    Alcohol/week: 3.0  standard drinks of alcohol    Types: 3 Glasses of wine per week    Comment: occasional wine   Drug use: No  [2]  Current Facility-Administered Medications:    cefTRIAXone  (ROCEPHIN ) injection 1 g, 1 g, Intramuscular, Once, Van Knee, MD  Current Outpatient Medications:    cefdinir  (OMNICEF ) 300 MG capsule, Take 1 capsule (300 mg total) by mouth 2 (two) times daily., Disp: 20 capsule, Rfl: 0   ibuprofen  (ADVIL ) 600 MG  tablet, Take 1 tablet (600 mg total) by mouth every 6 (six) hours as needed., Disp: 30 tablet, Rfl: 0   ondansetron  (ZOFRAN -ODT) 8 MG disintegrating tablet, 1/2- 1 tablet q 8 hr prn nausea, vomiting, Disp: 20 tablet, Rfl: 0   tamsulosin  (FLOMAX ) 0.4 MG CAPS capsule, Take 1 capsule (0.4 mg total) by mouth at bedtime for 7 days., Disp: 7 capsule, Rfl: 0   acetaminophen  (TYLENOL ) 325 MG tablet, Take by mouth., Disp: , Rfl:    Ascorbic Acid (VITAMIN C PO), Take by mouth daily., Disp: , Rfl:    BIOTIN PO, Take by mouth daily., Disp: , Rfl:    buPROPion  (WELLBUTRIN  SR) 150 MG 12 hr tablet, Take 1 tablet (150 mg total) by mouth 2 (two) times daily., Disp: 180 tablet, Rfl: 2   Multiple Vitamins-Minerals (ZINC PO), Take by mouth daily., Disp: , Rfl:  [3] No Known Allergies    Van Knee, MD 02/02/24 1323

## 2024-02-02 NOTE — ED Triage Notes (Signed)
 Patient complains right flank pain and bodyaches  that started yesterday. Patient reports that she took Tylenol  on yesterday for pain with mild relief. Rates flank pain 4/10 and bodyaches 3/10.

## 2024-02-04 ENCOUNTER — Encounter: Payer: Self-pay | Admitting: Nurse Practitioner

## 2024-02-04 ENCOUNTER — Ambulatory Visit: Payer: Self-pay | Admitting: Emergency Medicine

## 2024-02-04 LAB — URINE CULTURE: Culture: >=100000 — AB

## 2024-02-06 ENCOUNTER — Emergency Department
Admission: EM | Admit: 2024-02-06 | Discharge: 2024-02-07 | Disposition: A | Discharge status: Home / Self Care | Attending: Emergency Medicine | Admitting: Emergency Medicine

## 2024-02-06 ENCOUNTER — Other Ambulatory Visit: Payer: Self-pay

## 2024-02-06 DIAGNOSIS — N200 Calculus of kidney: Secondary | ICD-10-CM | POA: Insufficient documentation

## 2024-02-06 DIAGNOSIS — I700 Atherosclerosis of aorta: Secondary | ICD-10-CM | POA: Insufficient documentation

## 2024-02-06 DIAGNOSIS — Z5321 Procedure and treatment not carried out due to patient leaving prior to being seen by health care provider: Secondary | ICD-10-CM | POA: Insufficient documentation

## 2024-02-06 DIAGNOSIS — R10A1 Flank pain, right side: Secondary | ICD-10-CM | POA: Insufficient documentation

## 2024-02-06 LAB — URINALYSIS, ROUTINE W REFLEX MICROSCOPIC
Bilirubin Urine: NEGATIVE
Glucose, UA: NEGATIVE mg/dL
Ketones, ur: NEGATIVE mg/dL
Nitrite: NEGATIVE
Protein, ur: 30 mg/dL — AB
Specific Gravity, Urine: 1.028 (ref 1.005–1.030)
pH: 5 (ref 5.0–8.0)

## 2024-02-06 NOTE — ED Triage Notes (Signed)
 PT is currently being treated for UTI with Omnicef  and Flomax  and is continuing to hurt in right flank. PT has been on medication for 5 days and pain is worsening.

## 2024-02-06 NOTE — ED Notes (Signed)
 Pt to desk and advised she was leaving. Couldn't convince her to stay

## 2024-02-07 ENCOUNTER — Emergency Department

## 2024-02-07 ENCOUNTER — Telehealth: Payer: Self-pay | Admitting: Emergency Medicine

## 2024-02-07 ENCOUNTER — Other Ambulatory Visit: Payer: Self-pay

## 2024-02-07 ENCOUNTER — Ambulatory Visit: Payer: Self-pay

## 2024-02-07 ENCOUNTER — Encounter: Payer: Self-pay | Admitting: Emergency Medicine

## 2024-02-07 ENCOUNTER — Emergency Department: Admission: EM | Admit: 2024-02-07 | Discharge: 2024-02-07 | Disposition: A | Discharge status: Home / Self Care

## 2024-02-07 DIAGNOSIS — R10A1 Flank pain, right side: Secondary | ICD-10-CM

## 2024-02-07 DIAGNOSIS — N390 Urinary tract infection, site not specified: Secondary | ICD-10-CM | POA: Insufficient documentation

## 2024-02-07 DIAGNOSIS — B9689 Other specified bacterial agents as the cause of diseases classified elsewhere: Secondary | ICD-10-CM | POA: Insufficient documentation

## 2024-02-07 LAB — CBC
HCT: 36.5 % (ref 36.0–46.0)
Hemoglobin: 12.2 g/dL (ref 12.0–15.0)
MCH: 31.5 pg (ref 26.0–34.0)
MCHC: 33.4 g/dL (ref 30.0–36.0)
MCV: 94.3 fL (ref 80.0–100.0)
Platelets: 445 10*3/uL — ABNORMAL HIGH (ref 150–400)
RBC: 3.87 MIL/uL (ref 3.87–5.11)
RDW: 12.1 % (ref 11.5–15.5)
WBC: 10.8 10*3/uL — ABNORMAL HIGH (ref 4.0–10.5)
nRBC: 0 % (ref 0.0–0.2)

## 2024-02-07 LAB — BASIC METABOLIC PANEL WITH GFR
Anion gap: 10 (ref 5–15)
BUN: 14 mg/dL (ref 6–20)
CO2: 26 mmol/L (ref 22–32)
Calcium: 10 mg/dL (ref 8.9–10.3)
Chloride: 104 mmol/L (ref 98–111)
Creatinine, Ser: 0.6 mg/dL (ref 0.44–1.00)
GFR, Estimated: >60 mL/min
Glucose, Bld: 136 mg/dL — ABNORMAL HIGH (ref 70–99)
Potassium: 3.7 mmol/L (ref 3.5–5.1)
Sodium: 140 mmol/L (ref 135–145)

## 2024-02-07 MED ORDER — CIPROFLOXACIN HCL 500 MG PO TABS: 500.0000 mg | ORAL_TABLET | Freq: Two times a day (BID) | ORAL | 0 refills | Status: AC

## 2024-02-07 NOTE — ED Provider Notes (Signed)
 "  [facility] Provider Note    Event Date/Time   First MD Initiated Contact with Patient 02/07/24 1103     (approximate)   History   Flank Pain   HPI  Shelly Tyler is a 59 y.o. female presenting to the emergency department complaining of right sided flank pain which has been ongoing for the last week.  The patient is currently being treated with cefdinir  and Flomax  for UTI and reports that she is not having any urinary symptoms but she is having this pain.  Denies any hematuria, denies any fevers, sweats, chills, nausea, vomiting, or diarrhea.  She reports that she was going to go to urgent care and was stented and prescription for a new antibiotic by them but was told to come to the ER.     Physical Exam   Triage Vital Signs: ED Triage Vitals  Encounter Vitals Group     BP 02/07/24 0947 (!) 141/71     Girls Systolic BP Percentile --      Girls Diastolic BP Percentile --      Boys Systolic BP Percentile --      Boys Diastolic BP Percentile --      Pulse Rate 02/07/24 0947 84     Resp 02/07/24 0947 20     Temp 02/07/24 0947 98.3 F (36.8 C)     Temp Source 02/07/24 0947 Oral     SpO2 02/07/24 0947 96 %     Weight 02/07/24 0945 134 lb (60.8 kg)     Height 02/07/24 0945 5' 6 (1.676 m)     Head Circumference --      Peak Flow --      Pain Score 02/07/24 0945 1     Pain Loc --      Pain Education --      Exclude from Growth Chart --     Most recent vital signs: Vitals:   02/07/24 0947  BP: (!) 141/71  Pulse: 84  Resp: 20  Temp: 98.3 F (36.8 C)  SpO2: 96%     General: Awake, no distress.  CV:  Good peripheral perfusion.  Resp:  Normal effort.  Abd:  No distention.  Other:     ED Results / Procedures / Treatments   Labs (all labs ordered are listed, but only abnormal results are displayed) Labs Reviewed  BASIC METABOLIC PANEL WITH GFR - Abnormal; Notable for the following components:      Result Value   Glucose, Bld 136 (*)     All other components within normal limits  CBC - Abnormal; Notable for the following components:   WBC 10.8 (*)    Platelets 445 (*)    All other components within normal limits     EKG     RADIOLOGY CT abd/pelvic IMPRESSION: 1. Bilateral nonobstructing renal calculi. 2. Aortic atherosclerosis.    PROCEDURES:  Critical Care performed: No  Procedures   MEDICATIONS ORDERED IN ED: Medications - No data to display   IMPRESSION / MDM / ASSESSMENT AND PLAN / ED COURSE  I reviewed the triage vital signs and the nursing notes.                              Differential diagnosis includes, but is not limited to, acute appendicitis, renal colic, testicular torsion, urinary tract infection/pyelonephritis, prostatitis,  epididymitis, diverticulitis, small bowel obstruction or ileus, colitis, abdominal aortic aneurysm, gastroenteritis,  hernia, etc.   Patient's presentation is most consistent with acute presentation with potential threat to life or bodily function.  Patient is a 59 year old female presenting to the emergency department complaining of right sided flank pain which has been ongoing for the last week.  Patient was seen here last night however she left without completion of her workup.  Urinalysis was performed last night which did continue to show bacteria and white blood cells in her urine.  Blood work here today is unremarkable.  CT of the abdomen was performed which showed bilateral nonobstructing renal calculi but no ureteral stones.\  Discussed results at length with the patient.  Discussed taking the antibiotic she was prescribed by urgent care.  She is to follow-up with her primary care physician within the next few days.  Return to the emergency department for new or worsening symptoms.       FINAL CLINICAL IMPRESSION(S) / ED DIAGNOSES   Final diagnoses:  Right flank pain  Lower urinary tract infectious disease     Rx / DC Orders   ED Discharge  Orders     None        Note:  This document was prepared using Dragon voice recognition software and may include unintentional dictation errors.   Rexford Reche HERO, MD 02/07/24 1607  "

## 2024-02-07 NOTE — Discharge Instructions (Signed)
 Please pick up your prescription for new antibiotic.  Tylenol /Motrin  as needed for pain control.  Follow-up with your primary care physician in the next week.  Return to the ER for new or worsening symptoms.

## 2024-02-07 NOTE — ED Triage Notes (Signed)
 Pt via POV from home. Pt c/o R sided flank pain for a week, pt is currently on Omicef and Flomax  for UTI. Pt was here last night but LWBS. Urine was obtained last night. Pt is A&OX4 and NAD, ambulatory to triage.

## 2024-02-07 NOTE — Telephone Encounter (Signed)
 Due to the weather having to close the clinic, patient coming in for UTI diagnosed in this clinic on 02/02/2024 while symptoms did not improve did not fully resolve, sending an additional antibiotic of ciprofloxacin  to the pharmacy based on urine culture showing E. coli
